# Patient Record
Sex: Male | Born: 1995 | Race: White | Hispanic: No | Marital: Married | Smoking: Never smoker
Health system: Southern US, Community
[De-identification: ages and names within clinical notes are randomized; demographics above are authoritative.]

## PROBLEM LIST (undated history)

## (undated) DIAGNOSIS — R51 Headache: Secondary | ICD-10-CM

## (undated) DIAGNOSIS — F419 Anxiety disorder, unspecified: Secondary | ICD-10-CM

## (undated) DIAGNOSIS — D751 Secondary polycythemia: Secondary | ICD-10-CM

## (undated) DIAGNOSIS — R519 Headache, unspecified: Secondary | ICD-10-CM

## (undated) HISTORY — DX: Headache, unspecified: R51.9

## (undated) HISTORY — PX: WISDOM TOOTH EXTRACTION: SHX21

## (undated) HISTORY — DX: Anxiety disorder, unspecified: F41.9

## (undated) HISTORY — DX: Secondary polycythemia: D75.1

## (undated) HISTORY — DX: Headache: R51

---
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
## 2017-02-24 ENCOUNTER — Ambulatory Visit (INDEPENDENT_AMBULATORY_CARE_PROVIDER_SITE_OTHER): Payer: No Typology Code available for payment source | Admitting: Family Medicine

## 2017-02-24 ENCOUNTER — Encounter: Payer: Self-pay | Admitting: Family Medicine

## 2017-02-24 DIAGNOSIS — K5900 Constipation, unspecified: Secondary | ICD-10-CM | POA: Insufficient documentation

## 2017-02-24 MED ORDER — POLYETHYLENE GLYCOL 3350 17 GM/SCOOP PO POWD: 17.0000 g | Freq: Every day | ORAL | 1 refills | Status: DC

## 2017-02-24 NOTE — Patient Instructions (Signed)
Miralax daily.  Follow up as needed.  Take care  Dr. Adriana Simas

## 2017-02-24 NOTE — Progress Notes (Signed)
   Subjective:  Patient ID: Donald Lane, male    DOB: 1996/02/11  Age: 21 y.o. MRN: [id number]  CC: Constipation/abdominal pain  HPI:  21 year old male presents with the above complaints.  Patient reports a 3-4 week history of constipation. He reports associated lower abdominal pain. Infrequent stooling. Hard to pass. Has to strain often. No hematochezia or melena. He reports significant stress. He has taken probiotics with some improvement. No laxatives or other medications tried. No fever. No reported weight loss.   Social Hx   Social History   Social History  . Marital status: Single    Spouse name: N/A  . Number of children: N/A  . Years of education: N/A   Social History Main Topics  . Smoking status: Never Smoker  . Smokeless tobacco: Never Used  . Alcohol use No  . Drug use: No  . Sexual activity: Not Asked   Other Topics Concern  . None   Social History Narrative  . None    Review of Systems  Constitutional: Negative.   Gastrointestinal: Positive for abdominal pain and constipation. Negative for blood in stool.  Psychiatric/Behavioral:       Stress.      Objective:  BP 122/80   Pulse 73   Temp 98.7 F (37.1 C) (Oral)   Wt 160 lb (72.6 kg)   SpO2 98%   BP/Weight 02/24/2017 11/23/2015  Systolic BP 122 108  Diastolic BP 80 82  Wt. (Lbs) 160 163  BMI - 21.65    Physical Exam  Constitutional: He appears well-developed. No distress.  Cardiovascular: Normal rate and regular rhythm.   Pulmonary/Chest: Effort normal and breath sounds normal.  Abdominal: Soft.  Mild tenderness in the periumbilical region.   Psychiatric:  Flat affect.   Vitals reviewed.  Assessment & Plan:   Problem List Items Addressed This Visit    Constipation    New problem. Trial of Miralax. Advised dietary changes and increased water intake.          Meds ordered this encounter  Medications  . polyethylene glycol powder (GLYCOLAX/MIRALAX) powder    Sig:  Take 17 g by mouth daily.    Dispense:  500 g    Refill:  1     Follow-up: PRN  Everlene Other DO [facility]

## 2017-02-24 NOTE — Progress Notes (Signed)
Pre visit review using our clinic review tool, if applicable. No additional management support is needed unless otherwise documented below in the visit note. 

## 2017-02-24 NOTE — Assessment & Plan Note (Signed)
New problem. Trial of Miralax. Advised dietary changes and increased water intake.

## 2017-06-24 ENCOUNTER — Encounter: Payer: Self-pay | Admitting: Family Medicine

## 2017-06-24 ENCOUNTER — Ambulatory Visit (INDEPENDENT_AMBULATORY_CARE_PROVIDER_SITE_OTHER): Payer: No Typology Code available for payment source | Admitting: Family Medicine

## 2017-06-24 VITALS — BP 116/70 | HR 73 | Temp 97.9°F | Wt 169.8 lb

## 2017-06-24 DIAGNOSIS — K5900 Constipation, unspecified: Secondary | ICD-10-CM | POA: Diagnosis not present

## 2017-06-24 DIAGNOSIS — G43009 Migraine without aura, not intractable, without status migrainosus: Secondary | ICD-10-CM | POA: Diagnosis not present

## 2017-06-24 DIAGNOSIS — K649 Unspecified hemorrhoids: Secondary | ICD-10-CM | POA: Diagnosis not present

## 2017-06-24 NOTE — Assessment & Plan Note (Signed)
Patient declined exam. Discussed checking stool cards. Discussed continuing Preparation H and returning for exam when he is ready.

## 2017-06-24 NOTE — Assessment & Plan Note (Addendum)
History consistent with migraines. Given age and change in headaches we will refer to neurology to consider treatment. Given return precautions.

## 2017-06-24 NOTE — Progress Notes (Signed)
Marikay Alar, MD Phone: [phone]  Donald Lane is a 21 y.o. male who presents today for same-day visit.  He states he is here for headaches. Notes chronic history of tension headaches that he describes as bitemporal headaches and frontal headache. Those can occur most days though over the past month he is having what he thinks are migraines. Feels like his whole head hurts. He has photophobia and phonophobia with it. Slight blurred vision when he has the headaches. He notes vomiting once with the headaches though no other vomiting. Notes a loss of energy with it. No numbness or weakness. He'll take 600-800 mg of ibuprofen once a day for the headache when he gets it. He does have a strong family history of migraines.  Patient reports abdominal pain from previously is significantly improved. He notes he is having more frequent bowel movements and is having them 2-3 times a day and they're normal. He notes occasionally having to strain. Notes the discomfort that he has had recently is more of an upset stomach issue. No significant abdominal pain. He does report some hemorrhoids that he does not want evaluated today that he wants to know what he can do for them. He's been trying Preparation H. Rarely he'll have a drop of blood when he wipes if he has to strain significantly.    ROS see history of present illness  Objective  Physical Exam Vitals:   06/24/17 0803  BP: 116/70  Pulse: 73  Temp: 97.9 F (36.6 C)  SpO2: 98%    BP Readings from Last 3 Encounters:  06/24/17 116/70  02/24/17 122/80  11/23/15 108/82   Wt Readings from Last 3 Encounters:  06/24/17 169 lb 12.8 oz (77 kg) (74 %, Z= 0.66)*  02/24/17 160 lb (72.6 kg) (64 %, Z= 0.37)*  11/23/15 163 lb (73.9 kg) (76 %, Z= 0.70)*   * Growth percentiles are based on CDC 2-20 Years data.    Physical Exam  Constitutional: No distress.  Cardiovascular: Normal rate, regular rhythm and normal heart sounds.     Pulmonary/Chest: Effort normal and breath sounds normal.  Abdominal: Soft. Bowel sounds are normal. He exhibits no distension. There is no tenderness.  Neurological: He is alert.  CN 2-12 intact, 5/5 strength in bilateral biceps, triceps, grip, quads, hamstrings, plantar and dorsiflexion, sensation to light touch intact in bilateral UE and LE, normal gait, normal rapid alternating movements, normal heel to shin tests, normal finger to nose test  Skin: Skin is warm and dry. He is not diaphoretic.     Assessment/Plan: Please see individual problem list.  Migraine without aura and without status migrainosus, not intractable History consistent with migraines. Given age and change in headaches we will refer to neurology to consider treatment. Given return precautions.  Hemorrhoids Patient declined exam. Discussed checking stool cards. Discussed continuing Preparation H and returning for exam when he is ready.  Constipation This has improved. Since this has improved his abdominal discomfort has improved significantly as well. Suspect constipation and straining is due to his hemorrhoids. Discussed occasional MiraLAX use if needed. He'll monitor and if does not continue to improve he'll let us know.   Orders Placed This Encounter  Procedures  . Fecal occult blood, imunochemical    Standing Status:   Future    Standing Expiration Date:   06/24/2018  . Ambulatory referral to Neurology    Referral Priority:   Routine    Referral Type:   Consultation    Referral Reason:  Specialty Services Required    Requested Specialty:   Neurology    Number of Visits Requested:   1   Marikay Alar, MD [facility]

## 2017-06-24 NOTE — Assessment & Plan Note (Signed)
This has improved. Since this has improved his abdominal discomfort has improved significantly as well. Suspect constipation and straining is due to his hemorrhoids. Discussed occasional MiraLAX use if needed. He'll monitor and if does not continue to improve he'll let us know.

## 2017-06-24 NOTE — Patient Instructions (Signed)
Nice to see you. We'll get you to see neurology. Please complete the stool cards. When you decide you want your hemorrhoids evaluated please let us know. If you develop numbness, weakness, rectal bleeding, or any new or changing symptoms please seek medical attention.

## 2017-09-10 ENCOUNTER — Ambulatory Visit: Payer: Self-pay | Admitting: *Deleted

## 2017-09-10 NOTE — Telephone Encounter (Signed)
   Reason for Disposition . Rectal bleeding is minimal (e.g., blood just on toilet paper, a few drops in toilet bowl)  Answer Assessment - Initial Assessment Questions Just finished zpac for a cold 1. APPEARANCE of BLOOD: "What color is it?" "Is it passed separately, on the surface of the stool, or mixed in with the stool?"  Slightly runny but on abx, light brown 2. AMOUNT: "How much blood was passed?"     Slight amount on tissue today pink 3. FREQUENCY: "How many times has blood been passed with the stools?"      Monday and 2 a day since 4. ONSET: "When was the blood first seen in the stools?" (Days or weeks)      Monday 5. DIARRHEA: "Is there also some diarrhea?" If so, ask: "How many diarrhea stools were passed in past 24 hours?"     Same as above 6. CONSTIPATION: "Do you have constipation?" If so, "How bad is it?"    no 7. RECURRENT SYMPTOMS: "Have you had blood in your stools before?" If so, ask: "When was the last time?" and "What happened that time?"     Bleeding hemrroids in the past 8. BLOOD THINNERS: "Do you take any blood thinners?" (e.g., Coumadin/warfarin, Pradaxa/dabigatran, aspirin)     no 9. OTHER SYMPTOMS: "Do you have any other symptoms?"  (e.g., abdominal pain, vomiting, dizziness, fever)     no 10. PREGNANCY: "Is there any chance you are pregnant?" "When was your last menstrual period?"     na  Protocols used: RECTAL BLEEDING-A-AH

## 2017-10-13 ENCOUNTER — Ambulatory Visit: Payer: No Typology Code available for payment source | Admitting: Family Medicine

## 2017-10-14 ENCOUNTER — Other Ambulatory Visit (HOSPITAL_COMMUNITY)
Admission: RE | Admit: 2017-10-14 | Discharge: 2017-10-14 | Disposition: A | Discharge status: Home / Self Care | Payer: No Typology Code available for payment source | Source: Ambulatory Visit | Attending: Internal Medicine | Admitting: Internal Medicine

## 2017-10-14 ENCOUNTER — Encounter: Payer: Self-pay | Admitting: Internal Medicine

## 2017-10-14 ENCOUNTER — Ambulatory Visit: Payer: No Typology Code available for payment source | Admitting: Internal Medicine

## 2017-10-14 DIAGNOSIS — Z0000 Encounter for general adult medical examination without abnormal findings: Secondary | ICD-10-CM

## 2017-10-14 DIAGNOSIS — Z113 Encounter for screening for infections with a predominantly sexual mode of transmission: Secondary | ICD-10-CM | POA: Insufficient documentation

## 2017-10-14 DIAGNOSIS — F419 Anxiety disorder, unspecified: Secondary | ICD-10-CM

## 2017-10-14 DIAGNOSIS — Z23 Encounter for immunization: Secondary | ICD-10-CM | POA: Diagnosis not present

## 2017-10-14 DIAGNOSIS — R51 Headache: Secondary | ICD-10-CM | POA: Diagnosis not present

## 2017-10-14 DIAGNOSIS — R519 Headache, unspecified: Secondary | ICD-10-CM

## 2017-10-14 DIAGNOSIS — F39 Unspecified mood [affective] disorder: Secondary | ICD-10-CM | POA: Insufficient documentation

## 2017-10-14 DIAGNOSIS — Z1329 Encounter for screening for other suspected endocrine disorder: Secondary | ICD-10-CM

## 2017-10-14 DIAGNOSIS — G43009 Migraine without aura, not intractable, without status migrainosus: Secondary | ICD-10-CM | POA: Diagnosis not present

## 2017-10-14 LAB — URINALYSIS, ROUTINE W REFLEX MICROSCOPIC
Bilirubin Urine: NEGATIVE
Hgb urine dipstick: NEGATIVE
Ketones, ur: NEGATIVE
Leukocytes, UA: NEGATIVE
NITRITE: NEGATIVE
RBC / HPF: NONE SEEN (ref 0–?)
SPECIFIC GRAVITY, URINE: 1.025 (ref 1.000–1.030)
Total Protein, Urine: NEGATIVE
Urine Glucose: NEGATIVE
Urobilinogen, UA: 0.2 (ref 0.0–1.0)
pH: 6 (ref 5.0–8.0)

## 2017-10-14 LAB — CBC WITH DIFFERENTIAL/PLATELET
BASOS PCT: 0.6 % (ref 0.0–3.0)
Basophils Absolute: 0 10*3/uL (ref 0.0–0.1)
EOS PCT: 1.4 % (ref 0.0–5.0)
Eosinophils Absolute: 0.1 10*3/uL (ref 0.0–0.7)
HEMATOCRIT: 49.2 % — AB (ref 36.0–49.0)
HEMOGLOBIN: 17.3 g/dL — AB (ref 12.0–16.0)
LYMPHS PCT: 42.7 % (ref 24.0–48.0)
Lymphs Abs: 2.2 10*3/uL (ref 0.7–4.0)
MCHC: 35.3 g/dL (ref 31.0–37.0)
MCV: 90 fl (ref 78.0–98.0)
Monocytes Absolute: 0.6 10*3/uL (ref 0.1–1.0)
Monocytes Relative: 10.8 % (ref 3.0–12.0)
Neutro Abs: 2.3 10*3/uL (ref 1.4–7.7)
Neutrophils Relative %: 44.5 % (ref 43.0–71.0)
Platelets: 172 10*3/uL (ref 150.0–575.0)
RBC: 5.46 Mil/uL (ref 3.80–5.70)
RDW: 12.4 % (ref 11.4–15.5)
WBC: 5.1 10*3/uL (ref 4.5–13.5)

## 2017-10-14 LAB — COMPREHENSIVE METABOLIC PANEL
ALBUMIN: 5.1 g/dL (ref 3.5–5.2)
ALT: 19 U/L (ref 0–53)
AST: 23 U/L (ref 0–37)
Alkaline Phosphatase: 77 U/L (ref 52–171)
BUN: 9 mg/dL (ref 6–23)
CALCIUM: 9.6 mg/dL (ref 8.4–10.5)
CHLORIDE: 102 meq/L (ref 96–112)
CO2: 28 mEq/L (ref 19–32)
Creatinine, Ser: 0.88 mg/dL (ref 0.40–1.50)
GFR: 118.36 mL/min (ref >60.00)
Glucose, Bld: 84 mg/dL (ref 70–99)
POTASSIUM: 3.8 meq/L (ref 3.5–5.1)
Sodium: 137 mEq/L (ref 135–145)
Total Bilirubin: 0.7 mg/dL (ref 0.2–1.2)
Total Protein: 7.9 g/dL (ref 6.0–8.3)

## 2017-10-14 LAB — T4, FREE: Free T4: 0.74 ng/dL (ref 0.60–1.60)

## 2017-10-14 LAB — TSH: TSH: 1.89 u[IU]/mL (ref 0.40–5.00)

## 2017-10-14 MED ORDER — BUSPIRONE HCL 7.5 MG PO TABS: 7.5000 mg | ORAL_TABLET | Freq: Two times a day (BID) | ORAL | 1 refills | Status: DC

## 2017-10-14 MED ORDER — SUMATRIPTAN SUCCINATE 25 MG PO TABS: 25.0000 mg | ORAL_TABLET | ORAL | 0 refills | Status: DC | PRN

## 2017-10-14 NOTE — Progress Notes (Addendum)
Chief Complaint  Patient presents with  . Headache  . Anxiety   Pt presents for f/u  1. Anxiety not controlled he used to be on meds in med school PHQ 9 14 today though he reports he is not sad. He is agreeable to therapy and medications for anxiety  2. Migraines-daily severity varies (5-6/10 max 10/10) and he is bothered by light and sound and has to get underneath a blanket. H/a can last from 1 to 24 hrs and takes Excedrin migraines which helps sometimes and Ibuprofen. H/a associated with n/v but he does not think he needs medication. He has had h/a since middle school but h/a worsening x 1 year. FH mom and dad and m cousins with migraines. He does drink caffeine but has reduced coffee intake, but does drink soda typically caffeine free and does drink sweet tea.  He sleeps 6-8 hours qhs. H/a is around temples and frontal w/o other radiation. Eyes water at times with h/a like 1x per month. He does get his eye checked frequently.    3. ?hemorrhoids history he will not show me today for exam and reports they have reduced in size w/o bleeding and he is having regular stools w/o miralax    Review of Systems  HENT: Negative for hearing loss.   Eyes:       Denies vision problems   Respiratory: Negative for shortness of breath.   Cardiovascular: Negative for chest pain.  Gastrointestinal: Positive for nausea and vomiting. Negative for abdominal pain and constipation.  Musculoskeletal: Negative for joint pain.  Skin:       H/o moles bx'ed has one in right parietal scalp   Neurological: Positive for headaches.  Psychiatric/Behavioral: Negative for depression. The patient is nervous/anxious. The patient does not have insomnia.    Past Medical History:  Diagnosis Date  . Anxiety   . Frequent headaches    Past Surgical History:  Procedure Laterality Date  . TONSILLECTOMY AND ADENOIDECTOMY  2008   Family History  Problem Relation Age of Onset  . Alcohol abuse Paternal Aunt    Social History    Socioeconomic History  . Marital status: Single    Spouse name: Not on file  . Number of children: Not on file  . Years of education: Not on file  . Highest education level: Not on file  Social Needs  . Financial resource strain: Not on file  . Food insecurity - worry: Not on file  . Food insecurity - inability: Not on file  . Transportation needs - medical: Not on file  . Transportation needs - non-medical: Not on file  Occupational History  . Not on file  Tobacco Use  . Smoking status: Never Smoker  . Smokeless tobacco: Never Used  Substance and Sexual Activity  . Alcohol use: No    Alcohol/week: 0.0 oz  . Drug use: No  . Sexual activity: Not on file  Other Topics Concern  . Not on file  Social History Narrative  . Not on file   Current Meds  Medication Sig  . aspirin-acetaminophen-caffeine (MIGRAINE FORMULA) 250-250-65 MG tablet Take by mouth every 6 (six) hours as needed for headache. Excedrin migraine  . ibuprofen (ADVIL,MOTRIN) 200 MG tablet Take 200 mg by mouth every 6 (six) hours as needed.  . Lidocaine-Glycerin (PREPARATION H) 5-14.4 % CREA Place rectally.  . phenylephrine-shark liver oil-mineral oil-petrolatum (PREPARATION H) 0.25-14-74.9 % rectal ointment Place 1 application rectally 2 (two) times daily as needed for hemorrhoids.  No Known Allergies No results found for this or any previous visit (from the past 2160 hour(s)). Objective  There is no height or weight on file to calculate BMI. Wt Readings from Last 3 Encounters:  06/24/17 169 lb 12.8 oz (77 kg) (74 %, Z= 0.66)*  02/24/17 160 lb (72.6 kg) (64 %, Z= 0.37)*  11/23/15 163 lb (73.9 kg) (76 %, Z= 0.70)*   * Growth percentiles are based on CDC (Boys, 2-20 Years) data.   Temp Readings from Last 3 Encounters:  06/24/17 97.9 F (36.6 C) (Oral)  02/24/17 98.7 F (37.1 C) (Oral)  11/23/15 98.4 F (36.9 C) (Oral)   BP Readings from Last 3 Encounters:  06/24/17 116/70  02/24/17 122/80  11/23/15  108/82 (14 %, Z = -1.08 /  88 %, Z = 1.17)*   *BP percentiles are based on the August 2017 AAP Clinical Practice Guideline for boys   Pulse Readings from Last 3 Encounters:  06/24/17 73  02/24/17 73  11/23/15 67   o2 room air 98%  Physical Exam  Constitutional: He is oriented to person, place, and time and well-developed, well-nourished, and in no distress. Vital signs are normal.  HENT:  Head: Normocephalic and atraumatic.  Mouth/Throat: Oropharynx is clear and moist and mucous membranes are normal.  Eyes: Conjunctivae are normal. Pupils are equal, round, and reactive to light.  Cardiovascular: Normal rate, regular rhythm and normal heart sounds.  No murmur heard. Neg leg edema b/l   Pulmonary/Chest: Effort normal and breath sounds normal.  Abdominal: Soft. Bowel sounds are normal. There is no tenderness.  Neurological: He is alert and oriented to person, place, and time. He has normal motor skills. Gait normal. Gait normal.  CN 2-12 grossly intact moving all 4 ext  Nl motor strength   Skin: Skin is warm, dry and intact.     Psychiatric: Mood, memory, affect and judgment normal.  Nursing note and vitals reviewed.   Assessment   1. 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 Anxiety  3. H/o ? Hemorrhoids, constipation resolved though pt will not let MD look at rectal region  4. Hm  Plan  1.  MRI brain Prn Imitrex pt does not like meds qd so does not want to do preventive tx  [facility] to [facility] [place]   2. Buspirone 7.5 mg bid  Refer therapy here   3. Monitor pt not having to use Miralax   4.  Labs today CMET, CBC, UA, TSH, T4, STD check hep B status   Given flu shot today Ask about Tdap and HPV at f/u  rec call dermatology and sch tbse has had nevi bx'ed before.   STD check today   Provider: Dr. French Anaracy [facility]

## 2017-10-14 NOTE — Patient Instructions (Addendum)
Please schedule dermatology visit  Follow up in 4-5 weeks  See therapist  And start new medications imitrex migraines  Buspirone anxiety  Happy holidays  Migraine Headache A migraine headache is an intense, throbbing pain on one side or both sides of the head. Migraines may also cause other symptoms, such as nausea, vomiting, and sensitivity to light and noise. What are the causes? Doing or taking certain things may also trigger migraines, such as:  Alcohol.  Smoking.  Medicines, such as: ? Medicine used to treat chest pain (nitroglycerine). ? Birth control pills. ? Estrogen pills. ? Certain blood pressure medicines.  Aged cheeses, chocolate, or caffeine.  Foods or drinks that contain nitrates, glutamate, aspartame, or tyramine.  Physical activity.  Other things that may trigger a migraine include:  Menstruation.  Pregnancy.  Hunger.  Stress, lack of sleep, too much sleep, or fatigue.  Weather changes.  What increases the risk? The following factors may make you more likely to experience migraine headaches:  Age. Risk increases with age.  Family history of migraine headaches.  Being Caucasian.  Depression and anxiety.  Obesity.  Being a woman.  Having a hole in the heart (patent foramen ovale) or other heart problems.  What are the signs or symptoms? The main symptom of this condition is pulsating or throbbing pain. Pain may:  Happen in any area of the head, such as on one side or both sides.  Interfere with daily activities.  Get worse with physical activity.  Get worse with exposure to bright lights or loud noises.  Other symptoms may include:  Nausea.  Vomiting.  Dizziness.  General sensitivity to bright lights, loud noises, or smells.  Before you get a migraine, you may get warning signs that a migraine is developing (aura). An aura may include:  Seeing flashing lights or having blind spots.  Seeing bright spots, halos, or zigzag  lines.  Having tunnel vision or blurred vision.  Having numbness or a tingling feeling.  Having trouble talking.  Having muscle weakness.  How is this diagnosed? A migraine headache can be diagnosed based on:  Your symptoms.  A physical exam.  Tests, such as CT scan or MRI of the head. These imaging tests can help rule out other causes of headaches.  Taking fluid from the spine (lumbar puncture) and analyzing it (cerebrospinal fluid analysis, or CSF analysis).  How is this treated? A migraine headache is usually treated with medicines that:  Relieve pain.  Relieve nausea.  Prevent migraines from coming back.  Treatment may also include:  Acupuncture.  Lifestyle changes like avoiding foods that trigger migraines.  Follow these instructions at home: Medicines  Take over-the-counter and prescription medicines only as told by your health care provider.  Do not drive or use heavy machinery while taking prescription pain medicine.  To prevent or treat constipation while you are taking prescription pain medicine, your health care provider may recommend that you: ? Drink enough fluid to keep your urine clear or pale yellow. ? Take over-the-counter or prescription medicines. ? Eat foods that are high in fiber, such as fresh fruits and vegetables, whole grains, and beans. ? Limit foods that are high in fat and processed sugars, such as fried and sweet foods. Lifestyle  Avoid alcohol use.  Do not use any products that contain nicotine or tobacco, such as cigarettes and e-cigarettes. If you need help quitting, ask your health care provider.  Get at least 8 hours of sleep every night.  Limit  your stress. General instructions   Keep a journal to find out what may trigger your migraine headaches. For example, write down: ? What you eat and drink. ? How much sleep you get. ? Any change to your diet or medicines.  If you have a migraine: ? Avoid things that make your  symptoms worse, such as bright lights. ? It may help to lie down in a dark, quiet room. ? Do not drive or use heavy machinery. ? Ask your health care provider what activities are safe for you while you are experiencing symptoms.  Keep all follow-up visits as told by your health care provider. This is important. Contact a health care provider if:  You develop symptoms that are different or more severe than your usual migraine symptoms. Get help right away if:  Your migraine becomes severe.  You have a fever.  You have a stiff neck.  You have vision loss.  Your muscles feel weak or like you cannot control them.  You start to lose your balance often.  You develop trouble walking.  You faint. This information is not intended to replace advice given to you by your health care provider. Make sure you discuss any questions you have with your health care provider. Document Released: 10/14/2005 Document Revised: 05/03/2016 Document Reviewed: 04/01/2016 Elsevier Interactive Patient Education  2017 [employer].

## 2017-10-15 LAB — RPR: RPR Ser Ql: NONREACTIVE

## 2017-10-15 LAB — HEPATITIS C ANTIBODY
HEP C AB: NONREACTIVE
SIGNAL TO CUT-OFF: 0.01 (ref <1.00)

## 2017-10-15 LAB — URINE CYTOLOGY ANCILLARY ONLY
CHLAMYDIA, DNA PROBE: NEGATIVE
NEISSERIA GONORRHEA: NEGATIVE
Trichomonas: NEGATIVE

## 2017-10-15 LAB — HSV 1 ANTIBODY, IGG: HSV 1 Glycoprotein G Ab, IgG: <0.9 index

## 2017-10-15 LAB — HSV 2 ANTIBODY, IGG

## 2017-10-15 LAB — HEPATITIS B SURFACE ANTIGEN: Hepatitis B Surface Ag: NONREACTIVE

## 2017-10-15 LAB — HEPATITIS B SURFACE ANTIBODY, QUANTITATIVE

## 2017-10-15 LAB — HEPATITIS B CORE ANTIBODY, TOTAL: Hep B Core Total Ab: NONREACTIVE

## 2017-10-15 LAB — HIV ANTIBODY (ROUTINE TESTING W REFLEX): HIV: NONREACTIVE

## 2017-10-27 ENCOUNTER — Ambulatory Visit: Payer: No Typology Code available for payment source

## 2017-11-01 ENCOUNTER — Ambulatory Visit
Admission: RE | Admit: 2017-11-01 | Discharge: 2017-11-01 | Disposition: A | Discharge status: Home / Self Care | Payer: No Typology Code available for payment source | Source: Ambulatory Visit | Attending: Internal Medicine | Admitting: Internal Medicine

## 2017-11-01 DIAGNOSIS — R51 Headache: Secondary | ICD-10-CM | POA: Insufficient documentation

## 2017-11-11 ENCOUNTER — Encounter: Payer: Self-pay | Admitting: Neurology

## 2017-11-11 ENCOUNTER — Ambulatory Visit (INDEPENDENT_AMBULATORY_CARE_PROVIDER_SITE_OTHER): Payer: No Typology Code available for payment source | Admitting: Neurology

## 2017-11-11 VITALS — BP 134/80 | HR 98 | Ht 72.0 in | Wt 175.2 lb

## 2017-11-11 DIAGNOSIS — G44229 Chronic tension-type headache, not intractable: Secondary | ICD-10-CM

## 2017-11-11 DIAGNOSIS — G43009 Migraine without aura, not intractable, without status migrainosus: Secondary | ICD-10-CM

## 2017-11-11 MED ORDER — SUMATRIPTAN SUCCINATE 100 MG PO TABS: ORAL_TABLET | ORAL | 2 refills | Status: DC

## 2017-11-11 MED ORDER — NORTRIPTYLINE HCL 25 MG PO CAPS: 25.0000 mg | ORAL_CAPSULE | Freq: Every day | ORAL | 3 refills | Status: DC

## 2017-11-11 NOTE — Progress Notes (Signed)
NEUROLOGY CONSULTATION NOTE  Donald AlyChristopher J Lane MRN: [id number] DOB: 1996-03-03  Referring provider: Dr. Judie Lane Primary care provider: Dr. Birdie Lane  Reason for consult:  migraine  HISTORY OF PRESENT ILLNESS: Donald Lane is a 22 year old male with migraines and anxiety who presents for migraines.  History supplemented by primary medicine notes.  Onset:  He has had "headaches" since childhood but started having "migraines" for the past year Location:  Migraines:  Holocephalic/above right eye; Headaches:  Bi-temporal Quality:  Migraines:  Pounding; Headaches:  pressure Intensity:  Migraines:  Severe; Headaches: dull-moderate Aura:  no Prodrome:  no Postdrome:  no Associated symptoms:  Migraines: photophobia, phonophobia.  Sometimes nausea, rarely vomiting.  No osmophobia, autonomic symptoms or visual disturbance.  He denies new worse headache of his life or causing him to wake up from sleep Duration:  3 to 4 hours Frequency:  Migraines:  1 to 2 days a month; Headaches: 3 days a week Frequency of abortive medication: ibuprofen once a week Triggers/exacerbating factors:  stress Relieving factors:  rest Activity:  Migraines aggravated.  Headaches are not aggravated.  Past NSAIDS:  ASA Past analgesics:  Excedrin Past abortive triptans:  no Past muscle relaxants:  no Past anti-emetic:  no Past antihypertensive medications:  no Past antidepressant medications:  no Past anticonvulsant medications:  no Past vitamins/Herbal/Supplements:  Essential oils Past antihistamines/decongestants:  no Other past therapies:  no  Rescue protocol:  First ibuprofen (cannot tell if headache will progress to migraine.  Second, sumatriptan 25mg  (in 30 minutes if needed, repeat dose once) Current NSAIDS:  ibuprofen  Current analgesics:  no Current triptans:  sumatriptan 25mg  Current anti-emetic:  no Current muscle relaxants:  no Current anti-anxiolytic:   buspirone Current sleep aide:  no Current Antihypertensive medications:  no Current Antidepressant medications:  no Current Anticonvulsant medications:  no Current Vitamins/Herbal/Supplements:  no Current Antihistamines/Decongestants:  no Other therapy:  no   Caffeine:  Coffee once a week, tea Alcohol:  no Smoker:  no Diet:  Needs to increase water, soda Exercise:  no Depression/anxiety:  anxiety Sleep hygiene:  Sleeps well but does not follow routine sleep schedule Family history of headache:  mother, father, maternal cousins  MRI of brain without contrast from 11/01/17 was personally reviewed and is unremarkable.  10/14/17 CMP:  Na 137, K 3.8, Cl 102, CO2 28, glucose 84, BUN 9, Cr 0.88, t bili 0.7, ALP 77, AST 23, ALT 19.  PAST MEDICAL HISTORY: Past Medical History:  Diagnosis Date  . Anxiety   . Frequent headaches     PAST SURGICAL HISTORY: Past Surgical History:  Procedure Laterality Date  . TONSILLECTOMY AND ADENOIDECTOMY  2008  . WISDOM TOOTH EXTRACTION      MEDICATIONS: Current Outpatient Medications on File Prior to Visit  Medication Sig Dispense Refill  . aspirin-acetaminophen-caffeine (MIGRAINE FORMULA) 250-250-65 MG tablet Take by mouth every 6 (six) hours as needed for headache. Excedrin migraine    . busPIRone (BUSPAR) 7.5 MG tablet Take 1 tablet (7.5 mg total) by mouth 2 (two) times daily. 60 tablet 1  . ibuprofen (ADVIL,MOTRIN) 200 MG tablet Take 200 mg by mouth every 6 (six) hours as needed.    . Lidocaine-Glycerin (PREPARATION H) 5-14.4 % CREA Place rectally.    . phenylephrine-shark liver oil-mineral oil-petrolatum (PREPARATION H) 0.25-14-74.9 % rectal ointment Place 1 application rectally 2 (two) times daily as needed for hemorrhoids.     No current facility-administered medications on file prior to visit.     ALLERGIES: No  Known Allergies  FAMILY HISTORY: Family History  Problem Relation Age of Onset  . Alcohol abuse Paternal Aunt   . Diabetes  Father   . Diabetes Maternal Grandmother   . Migraines Maternal Grandfather   . Cancer Paternal Grandfather     SOCIAL HISTORY: Social History   Socioeconomic History  . Marital status: Single    Spouse name: Not on file  . Number of children: Not on file  . Years of education: Not on file  . Highest education level: 12th grade  Social Needs  . Financial resource strain: Not on file  . Food insecurity - worry: Not on file  . Food insecurity - inability: Not on file  . Transportation needs - medical: Not on file  . Transportation needs - non-medical: Not on file  Occupational History  . 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 Smoking status: Never Smoker  . Smokeless tobacco: Never Used  Substance and Sexual Activity  . Alcohol use: No    Alcohol/week: 0.0 oz  . Drug use: No  . Sexual activity: Not on file  Other Topics Concern  . Not on file  Social History Narrative   Single, lives with parents in a 2 story home. Has 3 dogs and 1 cat. Has an occasional coffee or soda. Drinks approximately 12oz of hot tea a day.    REVIEW OF SYSTEMS: Constitutional: No fevers, chills, or sweats, no generalized fatigue, change in appetite Eyes: No visual changes, double vision, eye pain Ear, nose and throat: No hearing loss, ear pain, nasal congestion, sore throat Cardiovascular: No chest pain, palpitations Respiratory:  No shortness of breath at rest or with exertion, wheezes GastrointestinaI: No nausea, vomiting, diarrhea, abdominal pain, fecal incontinence Genitourinary:  No dysuria, urinary retention or frequency Musculoskeletal:  No neck pain, back pain Integumentary: No rash, pruritus, skin lesions Neurological: as above Psychiatric: No depression, insomnia, anxiety Endocrine: No palpitations, fatigue, diaphoresis, mood swings, change in appetite, change in weight, increased thirst Hematologic/Lymphatic:  No purpura, petechiae. Allergic/Immunologic: no  itchy/runny eyes, nasal congestion, recent allergic reactions, rashes  PHYSICAL EXAM: Vitals:   11/11/17 1512  BP: 134/80  Pulse: 98  SpO2: 98%   General: No acute distress.  Patient appears well-groomed.  Head:  Normocephalic/atraumatic Eyes:  fundi examined but not visualized Neck: supple, no paraspinal tenderness, full range of motion Back: No paraspinal tenderness Heart: regular rate and rhythm Lungs: Clear to auscultation bilaterally. Vascular: No carotid bruits. Neurological Exam: Mental status: alert and oriented to person, place, and time, recent and remote memory intact, fund of knowledge intact, attention and concentration intact, speech fluent and not dysarthric, language intact. Cranial nerves: CN I: not tested CN II: pupils equal, round and reactive to light, visual fields intact CN III, IV, VI:  full range of motion, no nystagmus, no ptosis CN V: facial sensation intact CN VII: upper and lower face symmetric CN VIII: hearing intact CN IX, X: gag intact, uvula midline CN XI: sternocleidomastoid and trapezius muscles intact CN XII: tongue midline Bulk & Tone: normal, no fasciculations. Motor:  5/5 throughout  Sensation: temperature and vibration sensation intact. Deep Tendon Reflexes:  2+ throughout, toes downgoing.  Finger to nose testing:  Without dysmetria.  Heel to shin:  Without dysmetria.  Gait:  Normal station and stride.  Able to turn and tandem walk. Romberg negative.  IMPRESSION: Migraine without aura Chronic tension type headache  PLAN: 1.  Start nortriptyline 25mg  at bedtime.  We can increase dose in 4 weeks if needed. 2.  For abortive therapy, if ibuprofen not effective in 30 minutes, take sumatriptan 50 to 100mg .  May repeat dose once in 2 hours if needed. 3.  Lifestyle modification:  Increase water intake, increase exercise, routine sleep cycle, eliminate soda, herbal tea only 4.  Consider magnesium, B2, CoQ10 5.  Follow up in 3  months.  Thank you for allowing me to take part in the care of this patient.  Shon Millet, DO  CC:  Marikay Alar, MD  Quentin Ore, MD

## 2017-11-11 NOTE — Patient Instructions (Signed)
Migraine Recommendations: 1.  Start nortriptyline 25mg  at bedtime.  Call in 4 weeks with update and we can adjust dose if needed. 2.  Take ibuprofen 600mg  to 800mg  at earliest onset of headache at earliest onset of headache.  If not resolved in 30 minutes, take sumatriptan 50mg  to 100mg .  May repeat sumatriptan once in 2 hours if needed.  Do not exceed two doses in 24 hours. 3.  Limit use of pain relievers to no more than 2 days out of the week.  These medications include acetaminophen, ibuprofen, triptans and narcotics.  This will help reduce risk of rebound headaches. 4.  Be aware of common food triggers such as processed sweets, processed foods with nitrites (such as deli meat, hot dogs, sausages), foods with MSG, alcohol (such as wine), chocolate, certain cheeses, certain fruits (dried fruits, bananas, some citrus fruit), vinegar, diet soda. 4.  Avoid caffeine 5.  Routine exercise 6.  Proper sleep hygiene 7.  Stay adequately hydrated with water 8.  Keep a headache diary. 9.  Maintain proper stress management. 10.  Do not skip meals. 11.  Consider supplements:  Magnesium citrate 400mg  to 600mg  daily, riboflavin 400mg , Coenzyme Q 10 100mg  three times daily 12.  Follow up in 3 months.

## 2017-11-25 ENCOUNTER — Ambulatory Visit: Payer: No Typology Code available for payment source | Admitting: Internal Medicine

## 2017-11-25 ENCOUNTER — Encounter: Payer: Self-pay | Admitting: Internal Medicine

## 2017-11-25 VITALS — BP 102/60 | HR 97 | Temp 99.4°F | Ht 72.0 in | Wt 174.0 lb

## 2017-11-25 DIAGNOSIS — Z23 Encounter for immunization: Secondary | ICD-10-CM | POA: Diagnosis not present

## 2017-11-25 DIAGNOSIS — D751 Secondary polycythemia: Secondary | ICD-10-CM

## 2017-11-25 DIAGNOSIS — G43009 Migraine without aura, not intractable, without status migrainosus: Secondary | ICD-10-CM

## 2017-11-25 DIAGNOSIS — F419 Anxiety disorder, unspecified: Secondary | ICD-10-CM

## 2017-11-25 DIAGNOSIS — K5900 Constipation, unspecified: Secondary | ICD-10-CM | POA: Diagnosis not present

## 2017-11-25 DIAGNOSIS — G4700 Insomnia, unspecified: Secondary | ICD-10-CM | POA: Diagnosis not present

## 2017-11-25 LAB — CBC
HCT: 49.3 % — ABNORMAL HIGH (ref 36.0–49.0)
HEMOGLOBIN: 17.4 g/dL — AB (ref 12.0–16.0)
MCHC: 35.3 g/dL (ref 31.0–37.0)
MCV: 88.5 fl (ref 78.0–98.0)
Platelets: 151 10*3/uL (ref 150.0–575.0)
RBC: 5.58 Mil/uL (ref 3.80–5.70)
RDW: 12.5 % (ref 11.4–15.5)
WBC: 4.6 10*3/uL (ref 4.5–13.5)

## 2017-11-25 MED ORDER — BUSPIRONE HCL 10 MG PO TABS: 10.0000 mg | ORAL_TABLET | Freq: Two times a day (BID) | ORAL | 5 refills | Status: DC

## 2017-11-25 NOTE — Progress Notes (Signed)
Chief Complaint  Patient presents with  . Follow-up   Follow up  1. Migraines doing well MRI neg and saw Dr. Everlena Cooper neurology who Rx Nortriptyline he missed 2 doses and had h/a but has only had to use Imitrex x 1 time since last visit  2. Anxiety and sleep improved with buspar 7.5 mg bid, anxiety was causing lack of sleep but since anxiety better sleep is better. He does not think he needs to seek therapy as of now.  3. Constipation miralax helps and increased water intake helps and also increase fiber intake  4. Elevated H/H he reports before labs done he was [place] will repeat labs today .    Review of Systems  HENT: Negative for hearing loss.   Respiratory: Negative for shortness of breath.   Cardiovascular: Negative for chest pain.  Neurological: Negative for headaches.  Psychiatric/Behavioral:       Anxiety and sleep improved    Past Medical History:  Diagnosis Date  . Anxiety   . Frequent headaches    Past Surgical History:  Procedure Laterality Date  . TONSILLECTOMY AND ADENOIDECTOMY  2008  . WISDOM TOOTH EXTRACTION     Family History  Problem Relation Age of Onset  . Alcohol abuse Paternal Aunt   . Diabetes Father   . Diabetes Maternal Grandmother   . Migraines Maternal Grandfather   . Cancer Paternal Grandfather    Social History   Socioeconomic History  . Marital status: Single    Spouse name: Not on file  . Number of children: Not on file  . Years of education: Not on file  . Highest education level: 12th grade  Social Needs  . Financial resource strain: Not on file  . Food insecurity - worry: Not on file  . Food insecurity - inability: Not on file  . Transportation needs - medical: Not on file  . Transportation needs - non-medical: Not on file  Occupational History  . 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 Smoking status: Never Smoker  . Smokeless tobacco: Never Used  Substance and Sexual Activity  . Alcohol  use: No    Alcohol/week: 0.0 oz  . Drug use: No  . Sexual activity: Not on file  Other Topics Concern  . Not on file  Social History Narrative   Single, lives with parents in a 2 story home. Has 3 dogs and 1 cat. Has an occasional coffee or soda. Drinks approximately 12oz of hot tea a day.   Current Meds  Medication Sig  . aspirin-acetaminophen-caffeine (MIGRAINE FORMULA) 250-250-65 MG tablet Take by mouth every 6 (six) hours as needed for headache. Excedrin migraine  . busPIRone (BUSPAR) 10 MG tablet Take 1 tablet (10 mg total) by mouth 2 (two) times daily.  Marland Kitchen ibuprofen (ADVIL,MOTRIN) 200 MG tablet Take 200 mg by mouth every 6 (six) hours as needed.  . Lidocaine-Glycerin (PREPARATION H) 5-14.4 % CREA Place rectally.  . nortriptyline (PAMELOR) 25 MG capsule Take 1 capsule (25 mg total) by mouth at bedtime.  . phenylephrine-shark liver oil-mineral oil-petrolatum (PREPARATION H) 0.25-14-74.9 % rectal ointment Place 1 application rectally 2 (two) times daily as needed for hemorrhoids.  . SUMAtriptan (IMITREX) 100 MG tablet Take 1 tablet earliest onset of headache.  May repeat x1 in 2 hours if headache persists or recurs.  Do not exceed 2 tablets in 24 hours  . [DISCONTINUED] busPIRone (BUSPAR) 7.5 MG tablet Take 1 tablet (7.5  mg total) by mouth 2 (two) times daily.   No Known Allergies Recent Results (from the past 2160 hour(s))  Urine cytology ancillary only     Status: None   Collection Time: 10/14/17 12:00 AM  Result Value Ref Range   Chlamydia Negative     Comment: Normal Reference Range - Negative   Neisseria gonorrhea Negative     Comment: Normal Reference Range - Negative   Trichomonas Negative     Comment: Normal Reference Range - Negative  Comprehensive metabolic panel     Status: None   Collection Time: 10/14/17  1:51 PM  Result Value Ref Range   Sodium 137 135 - 145 mEq/L   Potassium 3.8 3.5 - 5.1 mEq/L   Chloride 102 96 - 112 mEq/L   CO2 28 19 - 32 mEq/L   Glucose, Bld  84 70 - 99 mg/dL   BUN 9 6 - 23 mg/dL   Creatinine, Ser 1.61 0.40 - 1.50 mg/dL   Total Bilirubin 0.7 0.2 - 1.2 mg/dL   Alkaline Phosphatase 77 52 - 171 U/L   AST 23 0 - 37 U/L   ALT 19 0 - 53 U/L   Total Protein 7.9 6.0 - 8.3 g/dL   Albumin 5.1 3.5 - 5.2 g/dL   Calcium 9.6 8.4 - 09.6 mg/dL   GFR 045.40 >98.11 mL/min  CBC with Differential/Platelet     Status: Abnormal   Collection Time: 10/14/17  1:51 PM  Result Value Ref Range   WBC 5.1 4.5 - 13.5 K/uL   RBC 5.46 3.80 - 5.70 Mil/uL   Hemoglobin 17.3 (H) 12.0 - 16.0 g/dL   HCT 91.4 (H) 78.2 - 95.6 %   MCV 90.0 78.0 - 98.0 fl   MCHC 35.3 31.0 - 37.0 g/dL   RDW 21.3 08.6 - 57.8 %   Platelets 172.0 150.0 - 575.0 K/uL   Neutrophils Relative % 44.5 43.0 - 71.0 %   Lymphocytes Relative 42.7 24.0 - 48.0 %   Monocytes Relative 10.8 3.0 - 12.0 %   Eosinophils Relative 1.4 0.0 - 5.0 %   Basophils Relative 0.6 0.0 - 3.0 %   Neutro Abs 2.3 1.4 - 7.7 K/uL   Lymphs Abs 2.2 0.7 - 4.0 K/uL   Monocytes Absolute 0.6 0.1 - 1.0 K/uL   Eosinophils Absolute 0.1 0.0 - 0.7 K/uL   Basophils Absolute 0.0 0.0 - 0.1 K/uL  T4, free     Status: None   Collection Time: 10/14/17  1:51 PM  Result Value Ref Range   Free T4 0.74 0.60 - 1.60 ng/dL    Comment: Specimens from patients who are undergoing biotin therapy and /or ingesting biotin supplements may contain high levels of biotin.  The higher biotin concentration in these specimens interferes with this Free T4 assay.  Specimens that contain high levels  of biotin may cause false high results for this Free T4 assay.  Please interpret results in light of the total clinical presentation of the patient.    TSH     Status: None   Collection Time: 10/14/17  1:51 PM  Result Value Ref Range   TSH 1.89 0.40 - 5.00 uIU/mL  Urinalysis, Routine w reflex microscopic     Status: None   Collection Time: 10/14/17  1:51 PM  Result Value Ref Range   Color, Urine YELLOW Yellow;Lt. Yellow   APPearance CLEAR Clear    Specific Gravity, Urine 1.025 1.000 - 1.030   pH 6.0 5.0 - 8.0  Total Protein, Urine NEGATIVE Negative   Urine Glucose NEGATIVE Negative   Ketones, ur NEGATIVE Negative   Bilirubin Urine NEGATIVE Negative   Hgb urine dipstick NEGATIVE Negative   Urobilinogen, UA 0.2 0.0 - 1.0   Leukocytes, UA NEGATIVE Negative   Nitrite NEGATIVE Negative   WBC, UA 0-2/hpf 0-2/hpf   RBC / HPF none seen 0-2/hpf  RPR     Status: None   Collection Time: 10/14/17  1:51 PM  Result Value Ref Range   RPR Ser Ql NON-REACTIVE NON-REACTI  HIV antibody     Status: None   Collection Time: 10/14/17  1:51 PM  Result Value Ref Range   HIV 1&2 Ab, 4th Generation NON-REACTIVE NON-REACTI    Comment: HIV-1 antigen and HIV-1/HIV-2 antibodies were not detected. There is no laboratory evidence of HIV infection. Marland Kitchen PLEASE NOTE: This information has been disclosed to you from records whose confidentiality may be protected by state law.  If your state requires such protection, then the state law prohibits you from making any further disclosure of the information without the specific written consent of the person to whom it pertains, or as otherwise permitted by law. A general authorization for the release of medical or other information is NOT sufficient for this purpose. . For additional information please refer to http://education.[facility].com/faq/FAQ106 (This link is being provided for informational/ educational purposes only.) . Marland Kitchen The performance of this assay has not been clinically validated in patients less than 43 years old. .   Hepatitis B surface antibody     Status: Abnormal   Collection Time: 10/14/17  1:51 PM  Result Value Ref Range   Hepatitis B-Post <5 (L) > OR = 10 mIU/mL    Comment: . Patient does not have immunity to hepatitis B virus. . For additional information, please refer to http://education.[facility].com/faq/FAQ105 (This link is being provided for  informational/ educational purposes only).   Hepatitis B core antibody, total     Status: None   Collection Time: 10/14/17  1:51 PM  Result Value Ref Range   Hep B Core Total Ab NON-REACTIVE NON-REACTI  Hepatitis B surface antigen     Status: None   Collection Time: 10/14/17  1:51 PM  Result Value Ref Range   Hepatitis B Surface Ag NON-REACTIVE NON-REACTI  Hepatitis C antibody     Status: None   Collection Time: 10/14/17  1:51 PM  Result Value Ref Range   Hepatitis C Ab NON-REACTIVE NON-REACTI   SIGNAL TO CUT-OFF 0.01 <1.00  HSV 1 antibody, IgG     Status: None   Collection Time: 10/14/17  1:51 PM  Result Value Ref Range   HSV 1 Glycoprotein G Ab, IgG <0.90 index    Comment:                           Index          Interpretation                           -----          --------------                           <0.90          Negative  0.90-1.09      Equivocal                           >1.09          Positive . This assay utilizes recombinant type-specific antigens to differentiate HSV-1 from HSV-2 infections. A positive result cannot distinguish between recent and past infection. If recent HSV infection is suspected but the results are negative or equivocal, the assay should be repeated in 4-6 weeks. The performance characteristics of the assay have not been established for pediatric populations, immunocompromised patients, or neonatal screening.   HSV 2 antibody, IgG     Status: None   Collection Time: 10/14/17  1:51 PM  Result Value Ref Range   HSV 2 Glycoprotein G Ab, IgG <0.90 index    Comment:                           Index          Interpretation                           -----          --------------                           <0.90          Negative                           0.90-1.09      Equivocal                           >1.09          Positive . This assay utilizes recombinant type-specific antigens to differentiate HSV-1 from HSV-2  infections. A positive result cannot distinguish between recent and past infection. If recent HSV infection is suspected but the results are negative or equivocal, the assay should be repeated in 4-6 weeks. The performance characteristics of the assay have not been established for pediatric populations, immunocompromised patients, or neonatal screening.    Objective  Body mass index is 23.6 kg/m. Wt Readings from Last 3 Encounters:  11/25/17 174 lb (78.9 kg) (77 %, Z= 0.74)*  11/11/17 175 lb 3.2 oz (79.5 kg) (78 %, Z= 0.78)*  06/24/17 169 lb 12.8 oz (77 kg) (74 %, Z= 0.66)*   * Growth percentiles are based on CDC (Boys, 2-20 Years) data.   Temp Readings from Last 3 Encounters:  11/25/17 99.4 F (37.4 C) (Oral)  06/24/17 97.9 F (36.6 C) (Oral)  02/24/17 98.7 F (37.1 C) (Oral)   BP Readings from Last 3 Encounters:  11/25/17 102/60 (2 %, Z = -2.07 /  11 %, Z = -1.24)*  11/11/17 134/80 (85 %, Z = 1.05 /  83 %, Z = 0.94)*  06/24/17 116/70   *BP percentiles are based on the August 2017 AAP Clinical Practice Guideline for boys   Pulse Readings from Last 3 Encounters:  11/25/17 97  11/11/17 98  06/24/17 73   O2 sat room air 97%  Physical Exam  Constitutional: He is oriented to person, place, and time and well-developed, well-nourished, and in no distress. Vital signs are normal.  HENT:  Head: Normocephalic and atraumatic.  Mouth/Throat: Oropharynx is clear and  moist and mucous membranes are normal.  Eyes: Conjunctivae are normal. Pupils are equal, round, and reactive to light.  Cardiovascular: Normal rate, regular rhythm and normal heart sounds.  Pulmonary/Chest: Effort normal and breath sounds normal.  Neurological: He is alert and oriented to person, place, and time. Gait normal. Gait normal.  Skin: Skin is warm and dry.  Psychiatric: Mood, memory, affect and judgment normal.  Nursing note and vitals reviewed.   Assessment   1. Migraines improved  2.  Anxiety/insomnia improved  3. Constipation sl improved  4. Polycythemia ? Etiology high altitude prior to lab draw  5. HM Plan  1. Cont meds f/u neurology  2. Increase Buspar to 10 mg bid  Hold on therapy referral  3. Cont supportive vare  4. Repeat CBC today  5.  Given hep B vaccine today f/u in 2 months and 6 months  Due for Tdap if really had in 2005  Check vx database for Tdap (had 2005) and HPV vaccines (had) STD neg  Dermatology appt sch Provider: Dr. French Ana McLean-Scocuzza-[facility]

## 2017-11-25 NOTE — Patient Instructions (Signed)
I am glad you are doing well  Please schedule nurse visit for 2 months then 6 months for hepatitis B vaccine and see me in  3-6 months  Take care  Generalized Anxiety Disorder, Adult Generalized anxiety disorder (GAD) is a mental health disorder. People with this condition constantly worry about everyday events. Unlike normal anxiety, worry related to GAD is not triggered by a specific event. These worries also do not fade or get better with time. GAD interferes with life functions, including relationships, work, and school. GAD can vary from mild to severe. People with severe GAD can have intense waves of anxiety with physical symptoms (panic attacks). What are the causes? The exact cause of GAD is not known. What increases the risk? This condition is more likely to develop in:  Women.  People who have a family history of anxiety disorders.  People who are very shy.  People who experience very stressful life events, such as the death of a loved one.  People who have a very stressful family environment.  What are the signs or symptoms? People with GAD often worry excessively about many things in their lives, such as their health and family. They may also be overly concerned about:  Doing well at work.  Being on time.  Natural disasters.  Friendships.  Physical symptoms of GAD include:  Fatigue.  Muscle tension or having muscle twitches.  Trembling or feeling shaky.  Being easily startled.  Feeling like your heart is pounding or racing.  Feeling out of breath or like you cannot take a deep breath.  Having trouble falling asleep or staying asleep.  Sweating.  Nausea, diarrhea, or irritable bowel syndrome (IBS).  Headaches.  Trouble concentrating or remembering facts.  Restlessness.  Irritability.  How is this diagnosed? Your health care provider can diagnose GAD based on your symptoms and medical history. You will also have a physical exam. The health care  provider will ask specific questions about your symptoms, including how severe they are, when they started, and if they come and go. Your health care provider may ask you about your use of alcohol or drugs, including prescription medicines. Your health care provider may refer you to a mental health specialist for further evaluation. Your health care provider will do a thorough examination and may perform additional tests to rule out other possible causes of your symptoms. To be diagnosed with GAD, a person must have anxiety that:  Is out of his or her control.  Affects several different aspects of his or her life, such as work and relationships.  Causes distress that makes him or her unable to take part in normal activities.  Includes at least three physical symptoms of GAD, such as restlessness, fatigue, trouble concentrating, irritability, muscle tension, or sleep problems.  Before your health care provider can confirm a diagnosis of GAD, these symptoms must be present more days than they are not, and they must last for six months or longer. How is this treated? The following therapies are usually used to treat GAD:  Medicine. Antidepressant medicine is usually prescribed for long-term daily control. Antianxiety medicines may be added in severe cases, especially when panic attacks occur.  Talk therapy (psychotherapy). Certain types of talk therapy can be helpful in treating GAD by providing support, education, and guidance. Options include: ? Cognitive behavioral therapy (CBT). People learn coping skills and techniques to ease their anxiety. They learn to identify unrealistic or negative thoughts and behaviors and to replace them with  positive ones. ? Acceptance and commitment therapy (ACT). This treatment teaches people how to be mindful as a way to cope with unwanted thoughts and feelings. ? Biofeedback. This process trains you to manage your body's response (physiological response) through  breathing techniques and relaxation methods. You will work with a therapist while machines are used to monitor your physical symptoms.  Stress management techniques. These include yoga, meditation, and exercise.  A mental health specialist can help determine which treatment is best for you. Some people see improvement with one type of therapy. However, other people require a combination of therapies. Follow these instructions at home:  Take over-the-counter and prescription medicines only as told by your health care provider.  Try to maintain a normal routine.  Try to anticipate stressful situations and allow extra time to manage them.  Practice any stress management or self-calming techniques as taught by your health care provider.  Do not punish yourself for setbacks or for not making progress.  Try to recognize your accomplishments, even if they are small.  Keep all follow-up visits as told by your health care provider. This is important. Contact a health care provider if:  Your symptoms do not get better.  Your symptoms get worse.  You have signs of depression, such as: ? A persistently sad, cranky, or irritable mood. ? Loss of enjoyment in activities that used to bring you joy. ? Change in weight or eating. ? Changes in sleeping habits. ? Avoiding friends or family members. ? Loss of energy for normal tasks. ? Feelings of guilt or worthlessness. Get help right away if:  You have serious thoughts about hurting yourself or others. If you ever feel like you may hurt yourself or others, or have thoughts about taking your own life, get help right away. You can go to your nearest emergency department or call:  Your local emergency services (911 in the [place]  A suicide crisis helpline, such as the National Suicide Prevention Lifeline at (931)155-8872. This is open 24 hours a day.  Summary  Generalized anxiety disorder (GAD) is a mental health disorder that involves worry  that is not triggered by a specific event.  People with GAD often worry excessively about many things in their lives, such as their health and family.  GAD may cause physical symptoms such as restlessness, trouble concentrating, sleep problems, frequent sweating, nausea, diarrhea, headaches, and trembling or muscle twitching.  A mental health specialist can help determine which treatment is best for you. Some people see improvement with one type of therapy. However, other people require a combination of therapies. This information is not intended to replace advice given to you by your health care provider. Make sure you discuss any questions you have with your health care provider. Document Released: 02/08/2013 Document Revised: 09/03/2016 Document Reviewed: 09/03/2016 Elsevier Interactive Patient Education  [employer].

## 2017-11-25 NOTE — Progress Notes (Signed)
Pre visit review using our clinic review tool, if applicable. No additional management support is needed unless otherwise documented below in the visit note. 

## 2017-11-27 DIAGNOSIS — G4700 Insomnia, unspecified: Secondary | ICD-10-CM | POA: Insufficient documentation

## 2017-12-04 ENCOUNTER — Other Ambulatory Visit: Payer: Self-pay | Admitting: Internal Medicine

## 2017-12-04 DIAGNOSIS — D751 Secondary polycythemia: Secondary | ICD-10-CM

## 2018-01-27 ENCOUNTER — Ambulatory Visit (INDEPENDENT_AMBULATORY_CARE_PROVIDER_SITE_OTHER): Payer: No Typology Code available for payment source

## 2018-01-27 DIAGNOSIS — Z23 Encounter for immunization: Secondary | ICD-10-CM | POA: Diagnosis not present

## 2018-01-27 DIAGNOSIS — Z0000 Encounter for general adult medical examination without abnormal findings: Secondary | ICD-10-CM

## 2018-01-27 NOTE — Progress Notes (Signed)
As we discussed, pt needs to be rescheduled for third hepatitis B vaccine.  He is currently schedule for 07/2018.  Hepatitis b vaccines are given (0 month, 1 month and 6 months (after first vaccine)).  Any questions or problems, see me.    Dr Lorin PicketScott

## 2018-01-27 NOTE — Progress Notes (Signed)
Hep B given in left deltoid tolerated well 2nd shot of 3 will come back and get next one in six months.  Reviewed above.  As we discussed pt needs to be scheduled for f/u third hepatitis B vaccine 6 months from original vaccine. (zero month, one month and 6 months from original).  Please change appt and notify pt.  Pt currently scheduled for 07/2018. Thanks.    Dr Lorin PicketScott

## 2018-02-18 ENCOUNTER — Ambulatory Visit (INDEPENDENT_AMBULATORY_CARE_PROVIDER_SITE_OTHER): Payer: No Typology Code available for payment source | Admitting: Neurology

## 2018-02-18 ENCOUNTER — Encounter: Payer: Self-pay | Admitting: Neurology

## 2018-02-18 VITALS — BP 120/82 | HR 95 | Ht 72.0 in | Wt 175.0 lb

## 2018-02-18 DIAGNOSIS — G43009 Migraine without aura, not intractable, without status migrainosus: Secondary | ICD-10-CM

## 2018-02-18 NOTE — Patient Instructions (Signed)
1.  Continue nortriptyline 25mg  at bedtime 2.  Use ibuprofen as needed, limited to no more than 2 days out of week. 3.  Follow up in 5 months.

## 2018-02-18 NOTE — Progress Notes (Signed)
NEUROLOGY FOLLOW UP OFFICE NOTE  Donald Lane [id number]  HISTORY OF PRESENT ILLNESS: Donald Lane is a 22 year old male with migraines and anxiety who follows up for migraine.  UPDATE: Last visit, he was started on nortriptyline.  Headaches have been much improved.  He has had a total of maybe 3 migraines over past 3 months.  Only one was a breakthrough migraine.  The other two occurred in setting of having missed doses of nortriptyline.  He notes that he is a little more drowsy in the morning since starting nortriptyline.  Also, his mood is a little more "dulled" but nothing that would prevent him from continuing the medication. intensity:  severe Duration:  1 to 2 hours Frequency:  Total of 3 over past 3 months. Rescue protocol:  First ibuprofen (cannot tell if headache will progress to migraine.  Second, sumatriptan 25mg  (in 30 minutes if needed, repeat dose once) Current NSAIDS:  ibuprofen  Current analgesics:  no Current triptans:  sumatriptan 50mg  to 100mg  Current anti-emetic:  no Current muscle relaxants:  no Current anti-anxiolytic:  buspirone Current sleep aide:  no Current Antihypertensive medications:  no Current Antidepressant medications:  nortriptyline 25mg  Current Anticonvulsant medications:  no Current Vitamins/Herbal/Supplements:  no Current Antihistamines/Decongestants:  no Other therapy:  no     Caffeine:  Coffee once a week, tea Alcohol:  no Smoker:  no Diet:  Increased water intake, little soda intake Exercise:  no Depression/anxiety:  anxiety Sleep hygiene:  Sleeps well but does not follow routine sleep schedule   HISTORY: Onset:  He has had "headaches" since childhood but started having "migraines" for the past year Location:  Migraines:  Holocephalic/above right eye; Headaches:  Bi-temporal Quality:  Migraines:  Pounding; Headaches:  pressure Initial Intensity:  Migraines:  Severe; Headaches: dull-moderate Aura:   no Prodrome:  no Postdrome:  no Associated symptoms:  Migraines: photophobia, phonophobia.  Sometimes nausea, rarely vomiting.  No osmophobia, autonomic symptoms or visual disturbance.  He denies new worse headache of his life or causing him to wake up from sleep Initial Duration:  3 to 4 hours Initial Frequency:  Migraines:  1 to 2 days a month; Headaches: 3 days a week Initial Frequency of abortive medication: ibuprofen once a week Triggers/exacerbating factors:  stress Relieving factors:  rest Activity:  Migraines aggravated.  Headaches are not aggravated.   Past NSAIDS:  ASA Past analgesics:  Excedrin Past abortive triptans:  no Past muscle relaxants:  no Past anti-emetic:  no Past antihypertensive medications:  no Past antidepressant medications:  no Past anticonvulsant medications:  no Past vitamins/Herbal/Supplements:  Essential oils Past antihistamines/decongestants:  no Other past therapies:  no   Family history of headache:  mother, father, maternal cousins   MRI of brain without contrast from 11/01/17 was personally reviewed and is unremarkable.  PAST MEDICAL HISTORY: Past Medical History:  Diagnosis Date  . Anxiety   . Frequent headaches     MEDICATIONS: Current Outpatient Medications on File Prior to Visit  Medication Sig Dispense Refill  . aspirin-acetaminophen-caffeine (MIGRAINE FORMULA) 250-250-65 MG tablet Take by mouth every 6 (six) hours as needed for headache. Excedrin migraine    . busPIRone (BUSPAR) 10 MG tablet Take 1 tablet (10 mg total) by mouth 2 (two) times daily. 60 tablet 5  . ibuprofen (ADVIL,MOTRIN) 200 MG tablet Take 200 mg by mouth every 6 (six) hours as needed.    . Lidocaine-Glycerin (PREPARATION H) 5-14.4 % CREA Place rectally.    . nortriptyline (  PAMELOR) 25 MG capsule Take 1 capsule (25 mg total) by mouth at bedtime. 30 capsule 3  . phenylephrine-shark liver oil-mineral oil-petrolatum (PREPARATION H) 0.25-14-74.9 % rectal ointment Place 1  application rectally 2 (two) times daily as needed for hemorrhoids.    . SUMAtriptan (IMITREX) 100 MG tablet Take 1 tablet earliest onset of headache.  May repeat x1 in 2 hours if headache persists or recurs.  Do not exceed 2 tablets in 24 hours 10 tablet 2   No current facility-administered medications on file prior to visit.     ALLERGIES: No Known Allergies  FAMILY HISTORY: Family History  Problem Relation Age of Onset  . Alcohol abuse Paternal Aunt   . Diabetes Father   . Diabetes Maternal Grandmother   . Migraines Maternal Grandfather   . Cancer Paternal Grandfather     SOCIAL HISTORY: Social History   Socioeconomic History  . Marital status: Single    Spouse name: Not on file  . Number of children: Not on file  . Years of education: Not on file  . Highest education level: 12th grade  Occupational History  . 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 Financial resource strain: Not on file  . Food insecurity:    Worry: Not on file    Inability: Not on file  . Transportation needs:    Medical: Not on file    Non-medical: Not on file  Tobacco Use  . Smoking status: Never Smoker  . Smokeless tobacco: Never Used  Substance and Sexual Activity  . Alcohol use: No    Alcohol/week: 0.0 oz  . Drug use: No  . Sexual activity: Not on file  Lifestyle  . Physical activity:    Days per week: Not on file    Minutes per session: Not on file  . Stress: Not on file  Relationships  . Social connections:    Talks on phone: Not on file    Gets together: Not on file    Attends religious service: Not on file    Active member of club or organization: Not on file    Attends meetings of clubs or organizations: Not on file    Relationship status: Not on file  . Intimate partner violence:    Fear of current or ex partner: Not on file    Emotionally abused: Not on file    Physically abused: Not on file    Forced sexual activity: Not on file  Other Topics  Concern  . Not on file  Social History Narrative   Single, lives with parents in a 2 story home. Has 3 dogs and 1 cat. Has an occasional coffee or soda. Drinks approximately 12oz of hot tea a day.    REVIEW OF SYSTEMS: Constitutional: No fevers, chills, or sweats, no generalized fatigue, change in appetite Eyes: No visual changes, double vision, eye pain Ear, nose and throat: No hearing loss, ear pain, nasal congestion, sore throat Cardiovascular: No chest pain, palpitations Respiratory:  No shortness of breath at rest or with exertion, wheezes GastrointestinaI: No nausea, vomiting, diarrhea, abdominal pain, fecal incontinence Genitourinary:  No dysuria, urinary retention or frequency Musculoskeletal:  No neck pain, back pain Integumentary: No rash, pruritus, skin lesions Neurological: as above Psychiatric: No depression, insomnia, anxiety Endocrine: No palpitations, fatigue, diaphoresis, mood swings, change in appetite, change in weight, increased thirst Hematologic/Lymphatic:  No purpura, petechiae. Allergic/Immunologic: no itchy/runny eyes, nasal congestion, recent allergic reactions, rashes  PHYSICAL  EXAM: Vitals:   02/18/18 1431  BP: 120/82  Pulse: 95  SpO2: 98%   General: No acute distress.  Patient appears well-groomed.  normal body habitus. Head:  Normocephalic/atraumatic Eyes:  Fundi examined but not visualized Neck: supple, no paraspinal tenderness, full range of motion Heart:  Regular rate and rhythm Lungs:  Clear to auscultation bilaterally Back: No paraspinal tenderness Neurological Exam: alert and oriented to person, place, and time. Attention span and concentration intact, recent and remote memory intact, fund of knowledge intact.  Speech fluent and not dysarthric, language intact.  CN II-XII intact. Bulk and tone normal, muscle strength 5/5 throughout.  Sensation to light touch, temperature and vibration intact.  Deep tendon reflexes 2+ throughout, toes downgoing.   Finger to nose and heel to shin testing intact.  Gait normal, Romberg negative.  IMPRESSION: Migraine without aura, not intractable, without status migrainosus  PLAN: Continue nortriptyline 25mg  at bedtime Ibuprofen as needed, limited to no more than 2 days out of week Headache diary Follow up in 5 months.  Shon MilletAdam Jaffe, DO  CC: Quentin Oreracy McLean-Scocuzza, MD

## 2018-04-03 ENCOUNTER — Encounter: Payer: Self-pay | Admitting: Internal Medicine

## 2018-04-03 ENCOUNTER — Other Ambulatory Visit: Payer: Self-pay

## 2018-04-03 ENCOUNTER — Other Ambulatory Visit: Payer: Self-pay | Admitting: Internal Medicine

## 2018-04-03 ENCOUNTER — Encounter: Payer: Self-pay | Admitting: Neurology

## 2018-04-03 DIAGNOSIS — F419 Anxiety disorder, unspecified: Secondary | ICD-10-CM

## 2018-04-03 MED ORDER — NORTRIPTYLINE HCL 25 MG PO CAPS: 25.0000 mg | ORAL_CAPSULE | Freq: Every day | ORAL | 3 refills | Status: DC

## 2018-04-03 MED ORDER — BUSPIRONE HCL 10 MG PO TABS: 10.0000 mg | ORAL_TABLET | Freq: Two times a day (BID) | ORAL | 11 refills | Status: DC

## 2018-04-07 ENCOUNTER — Encounter: Payer: Self-pay | Admitting: Internal Medicine

## 2018-04-28 ENCOUNTER — Other Ambulatory Visit: Payer: Self-pay | Admitting: Internal Medicine

## 2018-04-28 ENCOUNTER — Other Ambulatory Visit: Payer: Self-pay | Admitting: Neurology

## 2018-04-28 DIAGNOSIS — F419 Anxiety disorder, unspecified: Secondary | ICD-10-CM

## 2018-05-25 ENCOUNTER — Other Ambulatory Visit: Payer: Self-pay

## 2018-05-25 ENCOUNTER — Encounter: Payer: Self-pay | Admitting: Internal Medicine

## 2018-05-25 ENCOUNTER — Ambulatory Visit: Payer: PRIVATE HEALTH INSURANCE | Admitting: Internal Medicine

## 2018-05-25 VITALS — BP 126/92 | HR 93 | Temp 98.1°F | Ht 72.0 in | Wt 181.2 lb

## 2018-05-25 DIAGNOSIS — T753XXA Motion sickness, initial encounter: Secondary | ICD-10-CM | POA: Insufficient documentation

## 2018-05-25 DIAGNOSIS — Z1159 Encounter for screening for other viral diseases: Secondary | ICD-10-CM

## 2018-05-25 DIAGNOSIS — D751 Secondary polycythemia: Secondary | ICD-10-CM

## 2018-05-25 DIAGNOSIS — F419 Anxiety disorder, unspecified: Secondary | ICD-10-CM

## 2018-05-25 DIAGNOSIS — Z23 Encounter for immunization: Secondary | ICD-10-CM | POA: Diagnosis not present

## 2018-05-25 DIAGNOSIS — G43009 Migraine without aura, not intractable, without status migrainosus: Secondary | ICD-10-CM

## 2018-05-25 DIAGNOSIS — Z0184 Encounter for antibody response examination: Secondary | ICD-10-CM | POA: Diagnosis not present

## 2018-05-25 DIAGNOSIS — Z1283 Encounter for screening for malignant neoplasm of skin: Secondary | ICD-10-CM

## 2018-05-25 DIAGNOSIS — G43909 Migraine, unspecified, not intractable, without status migrainosus: Secondary | ICD-10-CM

## 2018-05-25 LAB — CBC WITH DIFFERENTIAL/PLATELET
BASOS PCT: 0.5 % (ref 0.0–3.0)
Basophils Absolute: 0 10*3/uL (ref 0.0–0.1)
Eosinophils Absolute: 0.1 10*3/uL (ref 0.0–0.7)
Eosinophils Relative: 1.3 % (ref 0.0–5.0)
HEMATOCRIT: 49.1 % — AB (ref 36.0–49.0)
HEMOGLOBIN: 17.1 g/dL — AB (ref 12.0–16.0)
LYMPHS PCT: 42.4 % (ref 24.0–48.0)
Lymphs Abs: 2.2 10*3/uL (ref 0.7–4.0)
MCHC: 34.8 g/dL (ref 31.0–37.0)
MCV: 89.1 fl (ref 78.0–98.0)
MONOS PCT: 12 % (ref 3.0–12.0)
Monocytes Absolute: 0.6 10*3/uL (ref 0.1–1.0)
NEUTROS ABS: 2.2 10*3/uL (ref 1.4–7.7)
Neutrophils Relative %: 43.8 % (ref 43.0–71.0)
PLATELETS: 166 10*3/uL (ref 150.0–575.0)
RBC: 5.51 Mil/uL (ref 3.80–5.70)
RDW: 12.4 % (ref 11.4–15.5)
WBC: 5.1 10*3/uL (ref 4.5–13.5)

## 2018-05-25 MED ORDER — SUMATRIPTAN SUCCINATE 100 MG PO TABS: ORAL_TABLET | ORAL | 5 refills | Status: DC

## 2018-05-25 MED ORDER — SCOPOLAMINE 1 MG/3DAYS TD PT72: 1.0000 | MEDICATED_PATCH | TRANSDERMAL | 0 refills | Status: DC

## 2018-05-25 NOTE — Addendum Note (Signed)
Addended by: Martie LeeFOY, PATINA C on: 05/25/2018 01:57 PM   Modules accepted: Orders

## 2018-05-25 NOTE — Progress Notes (Addendum)
Chief Complaint  Patient presents with  . Follow-up   F/u  1. Migraines controlled on nortriptyline 25 mg qhs and needs refill imitrex  2. Wants motion sickness tx going on cruise to [place] with family and GF 3. 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  HENT: Negative for hearing loss.   Eyes: Negative for blurred vision.  Respiratory: Negative for shortness of breath.   Cardiovascular: Negative for chest pain.  Gastrointestinal: Negative for constipation.  Skin: Negative for rash.  Neurological:       +h/o motion sickness   Psychiatric/Behavioral:       Anxiety controlled     Past Medical History:  Diagnosis Date  . Anxiety   . Frequent headaches    Past Surgical History:  Procedure Laterality Date  . TONSILLECTOMY AND ADENOIDECTOMY  2008  . WISDOM TOOTH EXTRACTION     Family History  Problem Relation Age of Onset  . Alcohol abuse Paternal Aunt   . Diabetes Father   . Diabetes Maternal Grandmother   . Migraines Maternal Grandfather   . Cancer Paternal Grandfather    Social History   Socioeconomic History  . Marital status: Single    Spouse name: Not on file  . Number of children: Not on file  . Years of education: Not on file  . Highest education level: 12th grade  Occupational History  . 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 Financial resource strain: Not on file  . Food insecurity:    Worry: Not on file    Inability: Not on file  . Transportation needs:    Medical: Not on file    Non-medical: Not on file  Tobacco Use  . Smoking status: Never Smoker  . Smokeless tobacco: Never Used  Substance and Sexual Activity  . Alcohol use: No    Alcohol/week: 0.0 oz  . Drug use: No  . Sexual activity: Not on file  Lifestyle  . Physical activity:    Days per week: Not on  file    Minutes per session: Not on file  . Stress: Not on file  Relationships  . Social connections:    Talks on phone: Not on file    Gets together: Not on file    Attends religious service: Not on file    Active member of club or organization: Not on file    Attends meetings of clubs or organizations: Not on file    Relationship status: Not on file  . Intimate partner violence:    Fear of current or ex partner: Not on file    Emotionally abused: Not on file    Physically abused: Not on file    Forced sexual activity: Not on file  Other Topics Concern  . Not on file  Social History Narrative   Single, lives with parents in a 2 story home. Has 3 dogs and 1 cat. Has an occasional coffee or soda. Drinks approximately 12oz of hot tea a day.   Current Meds  Medication Sig  . aspirin-acetaminophen-caffeine (MIGRAINE FORMULA) 250-250-65 MG tablet Take by mouth every 6 (six) hours as needed for headache. Excedrin migraine  . busPIRone (BUSPAR) 10 MG tablet TAKE 1 TABLET BY MOUTH TWICE A DAY  . ibuprofen (ADVIL,MOTRIN) 200 MG tablet Take 200 mg  by mouth every 6 (six) hours as needed.  . nortriptyline (PAMELOR) 25 MG capsule Take 1 capsule (25 mg total) by mouth at bedtime.  . nortriptyline (PAMELOR) 25 MG capsule TAKE 1 CAPSULE (25 MG TOTAL) BY MOUTH AT BEDTIME.  . SUMAtriptan (IMITREX) 100 MG tablet Take 1 tablet earliest onset of headache.  May repeat x1 in 2 hours if headache persists or recurs.  Do not exceed 2 tablets in 24 hours   No Known Allergies No results found for this or any previous visit (from the past 2160 hour(s)). Objective  Body mass index is 24.58 kg/m. Wt Readings from Last 3 Encounters:  05/25/18 181 lb 3.2 oz (82.2 kg) (82 %, Z= 0.90)*  02/18/18 175 lb (79.4 kg) (77 %, Z= 0.74)*  11/25/17 174 lb (78.9 kg) (77 %, Z= 0.74)*   * Growth percentiles are based on CDC (Boys, 2-20 Years) data.   Temp Readings from Last 3 Encounters:  05/25/18 98.1 F (36.7 C)   11/25/17 99.4 F (37.4 C) (Oral)  06/24/17 97.9 F (36.6 C) (Oral)   BP Readings from Last 3 Encounters:  05/25/18 (!) 126/92  02/18/18 120/82  11/25/17 102/60   Pulse Readings from Last 3 Encounters:  05/25/18 93  02/18/18 95  11/25/17 97    Physical Exam  Constitutional: He is oriented to person, place, and time. Vital signs are normal. He appears well-developed and well-nourished. He is cooperative.  HENT:  Head: Normocephalic and atraumatic.  Mouth/Throat: Oropharynx is clear and moist and mucous membranes are normal.  Eyes: Pupils are equal, round, and reactive to light. Conjunctivae are normal.  Cardiovascular: Normal rate, regular rhythm and normal heart sounds.  Pulmonary/Chest: Effort normal and breath sounds normal.  Neurological: He is alert and oriented to person, place, and time. Gait normal.  Skin: Skin is warm, dry and intact.  Psychiatric: He has a normal mood and affect. His speech is normal and behavior is normal. Judgment and thought content normal. Cognition and memory are normal.  Nursing note and vitals reviewed.   Assessment   1. Migraines  2. Motion sickness  3. Polycythemia  4. Anxiety controlled  5. HM Plan   1. Cont nortriptyline  Refilled imitrex  2. scopalamine patch q3 days 7 day trip  Disc sea band and otc dramamine  3. Cbc repeat with Jak 2 denies apnea  4. Cont meds controlled  5.  Given hep B vaccine today 3/3   Given Tdap today CMA Patina gave pt Td not Tdap will need Tdap in future  HPV vaccines (had) STD neg  Dermatology [place] [facility] referred today on [facility]  Check CBC, MMR,Jak 2 today    Provider: Dr. Olivia Mackie McLean-Scocuzza-Internal Medicine

## 2018-05-25 NOTE — Patient Instructions (Addendum)
Reschedule dermatology appt Try [employer] wrist bracelet for motion sickness or Dramamine over the counter chewable   Try Scopolamine patch  F/u in 6 months sooner if needed  We will let you know about your labs     Scopolamine skin patches What is this medicine? SCOPOLAMINE (skoe POL a meen) is used to prevent nausea and vomiting caused by motion sickness, anesthesia and surgery. This medicine may be used for other purposes; ask your health care provider or pharmacist if you have questions. COMMON BRAND NAME(S): Transderm Scop What should I tell my health care provider before I take this medicine? They need to know if you have any of these conditions: -glaucoma -kidney or liver disease -an unusual or allergic reaction (especially skin allergy) to scopolamine, atropine, other medicines, foods, dyes, or preservatives -pregnant or trying to get pregnant -breast-feeding How should I use this medicine? This medicine is for external use only. Follow the directions on the prescription label. One patch contains enough medicine to prevent motion sickness for up to 3 days. Apply the patch at least 4 hours before you need it and only wear one disc at a time. Choose an area behind the ear, that is clean, dry, hairless and free from any cuts or irritation. Wipe the area with a clean dry tissue. Peel off the plastic backing of the skin patch, trying not to touch the adhesive side with your hands. Do not cut the patches. Firmly apply to the area you have chosen, with the metallic side of the patch to the skin and the tan-colored side showing. Once firmly in place, wash your hands well with soap and water. Remove the disc after 3 days, or sooner if you no longer need it. After removing the patch, wash your hands and the area behind your ear thoroughly with soap and water. The patch will still contain some medicine after use. To avoid accidental contact or ingestion by children or pets, fold the used patch in  half with the sticky side together and throw away in the trash out of the reach of children and pets. If you need to use a second patch after you remove the first, place it behind the other ear. Talk to your pediatrician regarding the use of this medicine in children. Special care may be needed. Overdosage: If you think you have taken too much of this medicine contact a poison control center or emergency room at once. NOTE: This medicine is only for you. Do not share this medicine with others. What if I miss a dose? Make sure you apply the patch at least 4 hours before you need it. You can apply it the night before traveling. What may interact with this medicine? -benztropine -bethanechol -medicines for anxiety or sleeping problems like diazepam or temazepam -medicines for hay fever and other allergies -medicines for mental depression -muscle relaxants This list may not describe all possible interactions. Give your health care provider a list of all the medicines, herbs, non-prescription drugs, or dietary supplements you use. Also tell them if you smoke, drink alcohol, or use illegal drugs. Some items may interact with your medicine. What should I watch for while using this medicine? Keep the patch dry, if possible, to prevent it from falling off. Limited contact with water, however, as in bathing or swimming, will not affect the system. If the patch falls off, throw it away and put a new one behind the other ear. You may get drowsy or dizzy. Do not drive,  use machinery, or do anything that needs mental alertness until you know how this medicine affects you. Do not stand or sit up quickly, especially if you are an older patient. This reduces the risk of dizzy or fainting spells. Alcohol may interfere with the effect of this medicine. Avoid alcoholic drinks. Your mouth may get dry. Chewing sugarless gum or sucking hard candy, and drinking plenty of water may help. Contact your doctor if the problem  does not go away or is severe. This medicine may cause dry eyes and blurred vision. If you wear contact lenses you may feel some discomfort. Lubricating drops may help. See your eye doctor if the problem does not go away or is severe. If you are going to have a magnetic resonance imaging (MRI) procedure, tell your MRI technician if you have this patch on your body. It must be removed before a MRI. What side effects may I notice from receiving this medicine? Side effects that you should report to your doctor or health care professional as soon as possible: -agitation, nervousness, confusion -blurred vision and other eye problems -dizziness, drowsiness -eye pain or redness in the whites of the eye -hallucinations -pain or difficulty passing urine -skin rash, itching -vomiting Side effects that usually do not require medical attention (report to your doctor or health care professional if they continue or are bothersome): -headache -nausea This list may not describe all possible side effects. Call your doctor for medical advice about side effects. You may report side effects to FDA at 1-800-FDA-1088. Where should I keep my medicine? Keep out of the reach of children. Store at room temperature between 20 and 25 degrees C (68 and 77 degrees F). Throw away any unused medicine after the expiration date. When you remove a patch, fold it and throw it in the trash as described above. NOTE: This sheet is a summary. It may not cover all possible information. If you have questions about this medicine, talk to your doctor, pharmacist, or health care provider.  2018 Elsevier/Gold Standard (2012-03-12 13:31:48)   Motion Sickness Motion sickness is an unpleasant, temporary feeling of dizziness and nausea that occurs when a person is traveling in a moving vehicle. The person may also have abdominal pain, sweating, and paleness. This condition can occur while you travel in a boat, a car, or an airplane, or  even on an amusement park ride. The symptoms of motion sickness usually get better after the motion or traveling stops, but problems may persist for hours or days. What are the causes? This condition may be caused by overstimulation of a part of the inner ear that is called the semicircular canals. The semicircular canals help you to keep your balance by sending signals to your brain about movement. If these canals are stimulated too much from motion of your body, you can develop motion sickness. This condition can also occur if your brain gets conflicting signals from the various motion sensors in your body. The effects of motion sickness may be increased by stress, dehydration, other illnesses, or drinking too much alcohol. What increases the risk? This condition is more likely to develop in:  Children who are 70-36 years old.  Women, especially pregnant women or women who take birth control pills.  People who get migraine headaches.  People who have inner ear disorders.  People who take certain medicines.  What are the signs or symptoms? Symptoms of this condition include:  Nausea.  Dizziness.  Vomiting.  Sweating.  Pain in  the abdomen.  Being unsteady when walking.  Paleness.  How is this diagnosed? This condition is diagnosed based on a physical exam, your medical history, and your description of the symptoms that you have while traveling or moving. How is this treated? For most people, symptoms fade quickly after the motion stops. A health care provider may recommend or prescribe medicine to help prevent motion sickness. In some cases, this may be in the form of a patch that is placed behind the ear. Avoiding certain things or using certain techniques before or during travel can also help to prevent episodes of motion sickness. Follow these instructions at home:  Take medicines only as directed by your health care provider.  If you use a motion sickness patch, wash your  hands after you put the patch on. Touching your hands to your eyes after using the patch can enlarge (dilate) your pupils for 1-2 days and disturb your vision.  Drink enough fluid to keep your urine clear or pale yellow. You need to stay well hydrated. Take small, frequent sips of liquids as needed. How is this prevented?  If possible, avoid situations that cause your motion sickness.  Do not eat large meals before or during travel. When you travel longer distances, eat small, bland meals.  Do not drink alcohol before or during travel.  Sit in an area of the vehicle where the least motion occurs. ? On an airplane, sit near the wing. Lie back in your seat if possible. ? On a boat, sit near the middle. ? When you ride in a car, sit in the front seat, not the backseat.  Breathe slowly and deeply while you ride in a moving vehicle.  Do not read or focus on nearby objects when you are riding in a moving vehicle.  Try watching the horizon or a distant object while you are in a moving vehicle. This is especially helpful when you travel in a boat. In a car, ride in the front seat and look out the front window.  Do not smoke before or during travel. Avoid areas where people are smoking.  Get some fresh air if possible. For example, open a window when you are riding in a car.  Plan ahead for travel. Talk with your health care provider about whether you should use medicines to help prevent motion sickness. Contact a health care provider if:  Your vomiting or nausea does not go away within 24 hours.  You notice blood in your vomit. The blood could be dark red, or it may look like coffee grounds.  You faint or you have severe dizziness or light-headedness when you stand up. These may be signs of dehydration.  You have a fever. Get help right away if:  You have severe pain in your abdomen or chest.  You have trouble breathing.  You have a severe headache.  You develop weakness or  numbness on one side of your body.  You have trouble speaking. This information is not intended to replace advice given to you by your health care provider. Make sure you discuss any questions you have with your health care provider. Document Released: 10/14/2005 Document Revised: 03/18/2016 Document Reviewed: 09/21/2014 Elsevier Interactive Patient Education  2018 [employer].  Hepatitis B Vaccine, Recombinant injection What is this medicine? HEPATITIS B VACCINE (hep uh TAHY tis B VAK seen) is a vaccine. It is used to prevent an infection with the hepatitis B virus. This medicine may be used for other purposes;  ask your health care provider or pharmacist if you have questions. COMMON BRAND NAME(S): Engerix-B, Recombivax HB What should I tell my health care provider before I take this medicine? They need to know if you have any of these conditions: -fever, infection -heart disease -hepatitis B infection -immune system problems -kidney disease -an unusual or allergic reaction to vaccines, yeast, other medicines, foods, dyes, or preservatives -pregnant or trying to get pregnant -breast-feeding How should I use this medicine? This vaccine is for injection into a muscle. It is given by a health care professional. A copy of Vaccine Information Statements will be given before each vaccination. Read this sheet carefully each time. The sheet may change frequently. Talk to your pediatrician regarding the use of this medicine in children. While this drug may be prescribed for children as young as newborn for selected conditions, precautions do apply. Overdosage: If you think you have taken too much of this medicine contact a poison control center or emergency room at once. NOTE: This medicine is only for you. Do not share this medicine with others. What if I miss a dose? It is important not to miss your dose. Call your doctor or health care professional if you are unable to keep an  appointment. What may interact with this medicine? -medicines that suppress your immune function like adalimumab, anakinra, infliximab -medicines to treat cancer -steroid medicines like prednisone or cortisone This list may not describe all possible interactions. Give your health care provider a list of all the medicines, herbs, non-prescription drugs, or dietary supplements you use. Also tell them if you smoke, drink alcohol, or use illegal drugs. Some items may interact with your medicine. What should I watch for while using this medicine? See your health care provider for all shots of this vaccine as directed. You must have 3 shots of this vaccine for protection from hepatitis B infection. Tell your doctor right away if you have any serious or unusual side effects after getting this vaccine. What side effects may I notice from receiving this medicine? Side effects that you should report to your doctor or health care professional as soon as possible: -allergic reactions like skin rash, itching or hives, swelling of the face, lips, or tongue -breathing problems -confused, irritated -fast, irregular heartbeat -flu-like syndrome -numb, tingling pain -seizures -unusually weak or tired Side effects that usually do not require medical attention (report to your doctor or health care professional if they continue or are bothersome): -diarrhea -fever -headache -loss of appetite -muscle pain -nausea -pain, redness, swelling, or irritation at site where injected -tiredness This list may not describe all possible side effects. Call your doctor for medical advice about side effects. You may report side effects to FDA at 1-800-FDA-1088. Where should I keep my medicine? This drug is given in a hospital or clinic and will not be stored at home. NOTE: This sheet is a summary. It may not cover all possible information. If you have questions about this medicine, talk to your doctor, pharmacist, or  health care provider.  2018 Elsevier/Gold Standard (2014-02-14 13:26:01)  DTaP Vaccine (Diphtheria, Tetanus, and Pertussis): What You Need to Know 1. Why get vaccinated? Diphtheria, tetanus, and pertussis are serious diseases caused by bacteria. Diphtheria and pertussis are spread from person to person. Tetanus enters the body through cuts or wounds. DIPHTHERIA causes a thick covering in the back of the throat.  It can lead to breathing problems, paralysis, heart failure, and even death.  TETANUS (Lockjaw) causes painful tightening  of the muscles, usually all over the body.  It can lead to "locking" of the jaw so the victim cannot open his mouth or swallow. Tetanus leads to death in up to 2 out of 10 cases.  PERTUSSIS (Whooping Cough) causes coughing spells so bad that it is hard for infants to eat, drink, or breathe. These spells can last for weeks.  It can lead to pneumonia, seizures (jerking and staring spells), brain damage, and death.  Diphtheria, tetanus, and pertussis vaccine (DTaP) can help prevent these diseases. Most children who are vaccinated with DTaP will be protected throughout childhood. Many more children would get these diseases if we stopped vaccinating. DTaP is a safer version of an older vaccine called DTP. DTP is no longer used in the [place]. 2. Who should get DTaP vaccine and when? Children should get 5 doses of DTaP vaccine, one dose at each of the following ages:  2 months  4 months  6 months  15-18 months  4-6 years  DTaP may be given at the same time as other vaccines. 3. Some children should not get DTaP vaccine or should wait  Children with minor illnesses, such as a cold, may be vaccinated. But children who are moderately or severely ill should usually wait until they recover before getting DTaP vaccine.  Any child who had a life-threatening allergic reaction after a dose of DTaP should not get another dose.  Any child who suffered a brain  or nervous system disease within 7 days after a dose of DTaP should not get another dose.  Talk with your doctor if your child: ? had a seizure or collapsed after a dose of DTaP, ? cried non-stop for 3 hours or more after a dose of DTaP, ? had a fever over 105F after a dose of DTaP. Ask your doctor for more information. Some of these children should not get another dose of pertussis vaccine, but may get a vaccine without pertussis, called DT. 4. Older children and adults DTaP is not licensed for adolescents, adults, or children 66 years of age and older. But older people still need protection. A vaccine called Tdap is similar to DTaP. A single dose of Tdap is recommended for people 11 through 20 years of age. Another vaccine, called Td, protects against tetanus and diphtheria, but not pertussis. It is recommended every 10 years. There are separate Vaccine Information Statements for these vaccines. 5. What are the risks from DTaP vaccine? Getting diphtheria, tetanus, or pertussis disease is much riskier than getting DTaP vaccine. However, a vaccine, like any medicine, is capable of causing serious problems, such as severe allergic reactions. The risk of DTaP vaccine causing serious harm, or death, is extremely small. Mild problems (common)  Fever (up to about 1 child in 4)  Redness or swelling where the shot was given (up to about 1 child in 4)  Soreness or tenderness where the shot was given (up to about 1 child in 4) These problems occur more often after the 4th and 5th doses of the DTaP series than after earlier doses. Sometimes the 4th or 5th dose of DTaP vaccine is followed by swelling of the entire arm or leg in which the shot was given, lasting 1-7 days (up to about 1 child in 30). Other mild problems include:  Fussiness (up to about 1 child in 3)  Tiredness or poor appetite (up to about 1 child in 10)  Vomiting (up to about 1 child in 50) These  problems generally occur 1-3 days  after the shot. Moderate problems (uncommon)  Seizure (jerking or staring) (about 1 child out of 14,000)  Non-stop crying, for 3 hours or more (up to about 1 child out of 1,000)  High fever, over 105F (about 1 child out of 16,000) Severe problems (very rare)  Serious allergic reaction (less than 1 out of a million doses)  Several other severe problems have been reported after DTaP vaccine. These include: ? Long-term seizures, coma, or lowered consciousness ? Permanent brain damage. These are so rare it is hard to tell if they are caused by the vaccine. Controlling fever is especially important for children who have had seizures, for any reason. It is also important if another family member has had seizures. You can reduce fever and pain by giving your child an aspirin-free pain reliever when the shot is given, and for the next 24 hours, following the package instructions. 6. What if there is a serious reaction? What should I look for? Look for anything that concerns you, such as signs of a severe allergic reaction, very high fever, or behavior changes. Signs of a severe allergic reaction can include hives, swelling of the face and throat, difficulty breathing, a fast heartbeat, dizziness, and weakness. These would start a few minutes to a few hours after the vaccination. What should I do?  If you think it is a severe allergic reaction or other emergency that can't wait, call 9-1-1 or get the person to the nearest hospital. Otherwise, call your doctor.  Afterward, the reaction should be reported to the Vaccine Adverse Event Reporting System (VAERS). Your doctor might file this report, or you can do it yourself through the VAERS web site at www.vaers.LAgents.no, or by calling 1-480 150 1708. ? VAERS is only for reporting reactions. They do not give medical advice. 7. The National Vaccine Injury Compensation Program The [employer] Vaccine Injury Compensation Program (VICP) is a federal program  that was created to compensate people who may have been injured by certain vaccines. Persons who believe they may have been injured by a vaccine can learn about the program and about filing a claim by calling 1-(636)802-4983 or visiting the VICP website at SpiritualWord.at. 8. How can I learn more?  Ask your doctor.  Call your local or state health department.  Contact the Centers for Disease Control and Prevention (CDC): ? Call 410-260-4201 (1-800-CDC-INFO) or ? Visit CDC's website at PicCapture.uy CDC DTaP Vaccine (Diphtheria, Tetanus, and Pertussis) VIS (03/13/06) This information is not intended to replace advice given to you by your health care provider. Make sure you discuss any questions you have with your health care provider. Document Released: 08/11/2006 Document Revised: 07/04/2016 Document Reviewed: 07/04/2016 Elsevier Interactive Patient Education  2017 [employer].

## 2018-05-26 LAB — MEASLES/MUMPS/RUBELLA IMMUNITY
MUMPS IGG: 34.1 [AU]/ml
RUBELLA: 2.84 {index}
Rubeola IgG: 33.9 AU/mL

## 2018-05-28 ENCOUNTER — Encounter: Payer: Self-pay | Admitting: *Deleted

## 2018-05-29 LAB — JAK2 V617F MUTATION, QUANTITATIVE: JAK2 V617F Mutation Level: 0 % mutation

## 2018-06-01 ENCOUNTER — Encounter: Payer: Self-pay | Admitting: Internal Medicine

## 2018-06-01 ENCOUNTER — Other Ambulatory Visit: Payer: Self-pay | Admitting: Internal Medicine

## 2018-06-01 DIAGNOSIS — D751 Secondary polycythemia: Secondary | ICD-10-CM

## 2018-06-05 ENCOUNTER — Other Ambulatory Visit (INDEPENDENT_AMBULATORY_CARE_PROVIDER_SITE_OTHER): Payer: PRIVATE HEALTH INSURANCE

## 2018-06-05 ENCOUNTER — Telehealth: Payer: Self-pay | Admitting: Radiology

## 2018-06-05 DIAGNOSIS — D751 Secondary polycythemia: Secondary | ICD-10-CM | POA: Diagnosis not present

## 2018-06-05 NOTE — Telephone Encounter (Signed)
FYI. Pt came in for additional blood work today and while drawing pt, noticed a large bruise on the medial aspect of his right elbow. Pt stated he had blood drawn last week and a large bruise appeared. Pt denies any pain or discomfort at this time.

## 2018-06-05 NOTE — Telephone Encounter (Signed)
Mychart message has been sent to inform patient. 

## 2018-06-05 NOTE — Telephone Encounter (Signed)
He could have bruise from lab draw should resolve in 2 weeks if not getting better let me know   TMS

## 2018-06-05 NOTE — Addendum Note (Signed)
Addended by: Penne LashWIGGINS, Destini Cambre N on: 06/05/2018 03:18 PM   Modules accepted: Orders

## 2018-06-07 ENCOUNTER — Other Ambulatory Visit: Payer: Self-pay | Admitting: Internal Medicine

## 2018-06-07 ENCOUNTER — Encounter: Payer: Self-pay | Admitting: Internal Medicine

## 2018-06-07 DIAGNOSIS — D751 Secondary polycythemia: Secondary | ICD-10-CM

## 2018-06-07 LAB — STATUS REPORT

## 2018-06-07 NOTE — Progress Notes (Signed)
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 Denies snoring with apnea.

## 2018-06-08 LAB — IRON,TIBC AND FERRITIN PANEL
Ferritin: 67 ng/mL (ref 16–124)
Iron Saturation: 51 % (ref 15–55)
Iron: 154 ug/dL (ref 38–169)
TIBC: 300 ug/dL (ref 250–450)
UIBC: 146 ug/dL (ref 111–343)

## 2018-06-08 LAB — TRANSFERRIN SATURATION

## 2018-06-09 NOTE — Telephone Encounter (Signed)
Dr Valero EnergyMS has already placed an order for the referral.  Someone should be contacting him with an appt date and time.  Let us know if not contacted.

## 2018-06-16 NOTE — Progress Notes (Deleted)
[facility]  Telephone:(336) [phone] [phone]) [phone]  ID: Donald AlyChristopher J Lane OB: 01-Dec-1995  MR#: [id number]  QIO#:[id number]#:[id number]  Patient Care Team: McLean-Scocuzza, Pasty Spillersracy N, MD as PCP - General (Internal Medicine)   CHIEF COMPLAINT: Polycythemia.  INTERVAL HISTORY: ***  REVIEW OF SYSTEMS:   ROS  As per HPI. Otherwise, a complete review of systems is negative.  PAST MEDICAL HISTORY: Past Medical History:  Diagnosis Date  . Anxiety   . Frequent headaches   . Polycythemia     PAST SURGICAL HISTORY: Past Surgical History:  Procedure Laterality Date  . TONSILLECTOMY AND ADENOIDECTOMY  2008  . WISDOM TOOTH EXTRACTION      FAMILY HISTORY: Family History  Problem Relation Age of Onset  . Alcohol abuse Paternal Aunt   . Diabetes Father   . Diabetes Maternal Grandmother   . Migraines Maternal Grandfather   . Cancer Paternal Grandfather     ADVANCED DIRECTIVES (Y/N):  N  HEALTH MAINTENANCE: Social History   Tobacco Use  . Smoking status: Never Smoker  . Smokeless tobacco: Never Used  Substance Use Topics  . Alcohol use: No    Alcohol/week: 0.0 standard drinks  . Drug use: No     Colonoscopy:  PAP:  Bone density:  Lipid panel:  No Known Allergies  Current Outpatient Medications  Medication Sig Dispense Refill  . aspirin-acetaminophen-caffeine (MIGRAINE FORMULA) 250-250-65 MG tablet Take by mouth every 6 (six) hours as needed for headache. Excedrin migraine    . busPIRone (BUSPAR) 10 MG tablet TAKE 1 TABLET BY MOUTH TWICE A DAY 180 tablet 4  . ibuprofen (ADVIL,MOTRIN) 200 MG tablet Take 200 mg by mouth every 6 (six) hours as needed.    . Lidocaine-Glycerin (PREPARATION H) 5-14.4 % CREA Place rectally.    . nortriptyline (PAMELOR) 25 MG capsule Take 1 capsule (25 mg total) by mouth at bedtime. 30 capsule 3  . nortriptyline (PAMELOR) 25 MG capsule TAKE 1 CAPSULE (25 MG TOTAL) BY MOUTH AT BEDTIME. 90 capsule 2  . phenylephrine-shark  liver oil-mineral oil-petrolatum (PREPARATION H) 0.25-14-74.9 % rectal ointment Place 1 application rectally 2 (two) times daily as needed for hemorrhoids.    Marland Kitchen. scopolamine (TRANSDERM-SCOP) 1 MG/3DAYS Place 1 patch (1.5 mg total) onto the skin every 3 (three) days. Apply >4 hrs before cruise do not cut. Change every 3 days 4 patch 0  . SUMAtriptan (IMITREX) 100 MG tablet Take 1 tablet earliest onset of headache.  May repeat x1 in 2 hours if headache persists or recurs.  Do not exceed 2 tablets in 24 hours 10 tablet 5   No current facility-administered medications for this visit.     OBJECTIVE: There were no vitals filed for this visit.   There is no height or weight on file to calculate BMI.    ECOG FS:{CHL ONC [phone]}  General: Well-developed, well-nourished, no acute distress. Eyes: Pink conjunctiva, anicteric sclera. HEENT: Normocephalic, moist mucous membranes, clear oropharnyx. Lungs: Clear to auscultation bilaterally. Heart: Regular rate and rhythm. No rubs, murmurs, or gallops. Abdomen: Soft, nontender, nondistended. No organomegaly noted, normoactive bowel sounds. Musculoskeletal: No edema, cyanosis, or clubbing. Neuro: Alert, answering all questions appropriately. Cranial nerves grossly intact. Skin: No rashes or petechiae noted. Psych: Normal affect. Lymphatics: No cervical, calvicular, axillary or inguinal LAD.   LAB RESULTS:  Lab Results  Component Value Date   NA 137 10/14/2017   K 3.8 10/14/2017   CL 102 10/14/2017   CO2 28 10/14/2017   GLUCOSE 84 10/14/2017  BUN 9 10/14/2017   CREATININE 0.88 10/14/2017   CALCIUM 9.6 10/14/2017   PROT 7.9 10/14/2017   ALBUMIN 5.1 10/14/2017   AST 23 10/14/2017   ALT 19 10/14/2017   ALKPHOS 77 10/14/2017   BILITOT 0.7 10/14/2017    Lab Results  Component Value Date   WBC 5.1 05/25/2018   NEUTROABS 2.2 05/25/2018   HGB 17.1 (H) 05/25/2018   HCT 49.1 (H) 05/25/2018   MCV 89.1 05/25/2018   PLT 166.0 05/25/2018      STUDIES: No results found.  ASSESSMENT: Polycythemia  PLAN:    1. Polycythemia:  Patient expressed understanding and was in agreement with this plan. He also understands that He can call clinic at any time with any questions, concerns, or complaints.   Cancer Staging No matching staging information was found for the patient.  Jeralyn Ruthsimothy J Finnegan, MD   06/16/2018 11:00 PM

## 2018-06-18 ENCOUNTER — Inpatient Hospital Stay: Payer: PRIVATE HEALTH INSURANCE | Admitting: Oncology

## 2018-07-20 NOTE — Progress Notes (Deleted)
NEUROLOGY FOLLOW UP OFFICE NOTE  Donald Lane [id number]  HISTORY OF PRESENT ILLNESS: Donald Lane is a 22 year old male with migraines and anxiety who follows up for migraines.  UPDATE: Intensity:  *** Duration:  *** Frequency:  *** Frequency of abortive medication: *** Rescue protocol:  First ibuprofen, second sumatriptan 25mg  in 30 minutes Current NSAIDS:  Ibuprofen *** Current analgesics:  no Current triptans:  Sumatriptan 25mg  to 100mg  Current ergotamine:  no Current anti-emetic:  no Current muscle relaxants:  no Current anti-anxiolytic:  buspirone Current sleep aide:  no Current Antihypertensive medications:  no Current Antidepressant medications:  Nortriptyline 25mg  Current Anticonvulsant medications:  no Current anti-CGRP:  no Current Vitamins/Herbal/Supplements:  no Current Antihistamines/Decongestants:  no Other therapy:  no  Caffeine:  Coffee once a week, tea Alcohol:  no Smoker:  no Diet: hydrates, little soda Exercise:  no Depression:  no; Anxiety:  yes Other pain:  no Sleep hygiene:  Sleeps well but does not follow routine sleep schedule.  HISTORY: Onset:  He has had "headaches" since childhood, but started having "migraines" since 2018 Location:  Migraines:  Holocephalic/above right eye; Headaches:  Bi-temporal Quality:  Migraines:  Pounding; Headaches:  pressure Initial Intensity:  Migraines:  Severe; Headaches: dull-moderate Aura:  no Prodrome:  no Postdrome:  no Associated symptoms: Migraines:  Photophobia, phonophobia  Sometimes nausea, rarely vomiting.  No osmophobia, autonomic symptoms or visual disturbance.  He denies new worse headache of his life or causing him to wake up from sleep Initial Duration:  3 to 4 hours Initial Frequency:  Migraines:  1 to 2 days a month; Headaches: 3 days a week Initial Frequency of abortive medication: ibuprofen once a week Triggers/aggravating factors:  Emotional stress Relieving  factors:  rest Activity:  Migraines aggravated.  Headaches are not aggravated.  Past NSAIDS:  ASA Past analgesics:  Excedrin Past abortive triptans:  no Past muscle relaxants:  no Past anti-emetic:  no Past antihypertensive medications:  no Past antidepressant medications:  no Past anticonvulsant medications:  no Past vitamins/Herbal/Supplements:  Essential oils Past antihistamines/decongestants:  no Other past therapies:  no  Family history of headache:  mother, father, maternal cousins  MRI of brain without contrast from 11/01/17 was personally reviewed and is unremarkable.  PAST MEDICAL HISTORY: Past Medical History:  Diagnosis Date  . Anxiety   . Frequent headaches   . Polycythemia     MEDICATIONS: Current Outpatient Medications on File Prior to Visit  Medication Sig Dispense Refill  . aspirin-acetaminophen-caffeine (MIGRAINE FORMULA) 250-250-65 MG tablet Take by mouth every 6 (six) hours as needed for headache. Excedrin migraine    . busPIRone (BUSPAR) 10 MG tablet TAKE 1 TABLET BY MOUTH TWICE A DAY 180 tablet 4  . ibuprofen (ADVIL,MOTRIN) 200 MG tablet Take 200 mg by mouth every 6 (six) hours as needed.    . Lidocaine-Glycerin (PREPARATION H) 5-14.4 % CREA Place rectally.    . nortriptyline (PAMELOR) 25 MG capsule Take 1 capsule (25 mg total) by mouth at bedtime. 30 capsule 3  . nortriptyline (PAMELOR) 25 MG capsule TAKE 1 CAPSULE (25 MG TOTAL) BY MOUTH AT BEDTIME. 90 capsule 2  . phenylephrine-shark liver oil-mineral oil-petrolatum (PREPARATION H) 0.25-14-74.9 % rectal ointment Place 1 application rectally 2 (two) times daily as needed for hemorrhoids.    Marland Kitchen scopolamine (TRANSDERM-SCOP) 1 MG/3DAYS Place 1 patch (1.5 mg total) onto the skin every 3 (three) days. Apply >4 hrs before cruise do not cut. Change every 3 days 4 patch 0  .  SUMAtriptan (IMITREX) 100 MG tablet Take 1 tablet earliest onset of headache.  May repeat x1 in 2 hours if headache persists or recurs.  Do  not exceed 2 tablets in 24 hours 10 tablet 5   No current facility-administered medications on file prior to visit.     ALLERGIES: No Known Allergies  FAMILY HISTORY: Family History  Problem Relation Age of Onset  . Alcohol abuse Paternal Aunt   . Diabetes Father   . Diabetes Maternal Grandmother   . Migraines Maternal Grandfather   . Cancer Paternal Grandfather    SOCIAL HISTORY: Social History   Socioeconomic History  . Marital status: Single    Spouse name: Not on file  . Number of children: Not on file  . Years of education: Not on file  . Highest education level: 12th grade  Occupational History  . 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 Financial resource strain: Not on file  . Food insecurity:    Worry: Not on file    Inability: Not on file  . Transportation needs:    Medical: Not on file    Non-medical: Not on file  Tobacco Use  . Smoking status: Never Smoker  . Smokeless tobacco: Never Used  Substance and Sexual Activity  . Alcohol use: No    Alcohol/week: 0.0 standard drinks  . Drug use: No  . Sexual activity: Not on file  Lifestyle  . Physical activity:    Days per week: Not on file    Minutes per session: Not on file  . Stress: Not on file  Relationships  . Social connections:    Talks on phone: Not on file    Gets together: Not on file    Attends religious service: Not on file    Active member of club or organization: Not on file    Attends meetings of clubs or organizations: Not on file    Relationship status: Not on file  . Intimate partner violence:    Fear of current or ex partner: Not on file    Emotionally abused: Not on file    Physically abused: Not on file    Forced sexual activity: Not on file  Other Topics Concern  . Not on file  Social History Narrative   Single, lives with parents in a 2 story home. Has 3 dogs and 1 cat. Has an occasional coffee or soda. Drinks approximately 12oz of hot tea a day.     REVIEW OF SYSTEMS: Constitutional: No fevers, chills, or sweats, no generalized fatigue, change in appetite Eyes: No visual changes, double vision, eye pain Ear, nose and throat: No hearing loss, ear pain, nasal congestion, sore throat Cardiovascular: No chest pain, palpitations Respiratory:  No shortness of breath at rest or with exertion, wheezes GastrointestinaI: No nausea, vomiting, diarrhea, abdominal pain, fecal incontinence Genitourinary:  No dysuria, urinary retention or frequency Musculoskeletal:  No neck pain, back pain Integumentary: No rash, pruritus, skin lesions Neurological: as above Psychiatric: No depression, insomnia, anxiety Endocrine: No palpitations, fatigue, diaphoresis, mood swings, change in appetite, change in weight, increased thirst Hematologic/Lymphatic:  No purpura, petechiae. Allergic/Immunologic: no itchy/runny eyes, nasal congestion, recent allergic reactions, rashes  PHYSICAL EXAM: *** General: No acute distress.  Patient appears ***-groomed.  *** body habitus. Head:  Normocephalic/atraumatic Eyes:  Fundi examined but not visualized Neck: supple, no paraspinal tenderness, full range of motion Heart:  Regular rate and rhythm Lungs:  Clear to auscultation bilaterally  Back: No paraspinal tenderness Neurological Exam: alert and oriented to person, place, and time. Attention span and concentration intact, recent and remote memory intact, fund of knowledge intact.  Speech fluent and not dysarthric, language intact.  CN II-XII intact. Bulk and tone normal, muscle strength 5/5 throughout.  Sensation to light touch, temperature and vibration intact.  Deep tendon reflexes 2+ throughout, toes downgoing.  Finger to nose and heel to shin testing intact.  Gait normal, Romberg negative.  IMPRESSION: Migraine without aura, without status migrainosus, not intractable  PLAN: 1. Nortriptyline 25mg  at bedtime 2.  Ibuprofen and sumatriptan as needed for abortive  therapy 3.  Limit use of pain relievers to no more than 2 days out of week to prevent risk of rebound or medication-overuse headache. 4.  Keep headache diary 5.  Follow up in 6 months  Shon Millet, DO  CC: Quentin Ore, MD

## 2018-07-21 ENCOUNTER — Ambulatory Visit: Payer: No Typology Code available for payment source | Admitting: Neurology

## 2018-07-29 ENCOUNTER — Ambulatory Visit: Payer: PRIVATE HEALTH INSURANCE

## 2018-08-20 NOTE — Progress Notes (Signed)
NEUROLOGY FOLLOW UP OFFICE NOTE  Donald Lane [id number]  HISTORY OF PRESENT ILLNESS: Donald Lane is a 22 year old male with migraines and anxiety who follows up for migraines.  UPDATE: Intensity:  Migraines severe; headaches dull Duration:  Headaches in 30-60 minutes, migraines in 3 hours Frequency:  5-6 headache days a month (1-2 migraines) Headaches often triggered by dehydration Frequency of abortive medication: 5-6 days a month Rescue protocol:  First ibuprofen, second sumatriptan 50mg  in 30 minutes Current NSAIDS:  Ibuprofen  Current analgesics:  no Current triptans:  Sumatriptan 50mg  Current ergotamine:  no Current anti-emetic:  no Current muscle relaxants:  no Current anti-anxiolytic:  buspirone Current sleep aide:  no Current Antihypertensive medications:  no Current Antidepressant medications:  Nortriptyline 25mg  Current Anticonvulsant medications:  no Current anti-CGRP:  no Current Vitamins/Herbal/Supplements:  no Current Antihistamines/Decongestants:  no Other therapy:  no  Caffeine:  Coffee once a week, tea Alcohol:  no Smoker:  no Diet: needs to improve water intake, little soda Exercise:  no Depression:  no; Anxiety:  yes Other pain:  no Sleep hygiene:  Sleeps well but does not follow routine sleep schedule.  HISTORY: Onset:  He has had "headaches" since childhood, but started having "migraines" since 2018 Location:  Migraines:  Holocephalic/above right eye; Headaches:  Bi-temporal Quality:  Migraines:  Pounding; Headaches:  pressure Initial Intensity:  Migraines:  Severe; Headaches: dull-moderate Aura:  no Prodrome:  no Postdrome:  no Associated symptoms: Migraines:  Photophobia, phonophobia  Sometimes nausea, rarely vomiting.  No osmophobia, autonomic symptoms or visual disturbance.  He denies new worse headache of his life or causing him to wake up from sleep Initial Duration:  3 to 4 hours Initial Frequency:  Migraines:  1  to 2 days a month; Headaches: 3 days a week Initial Frequency of abortive medication: ibuprofen once a week Triggers/aggravating factors:  Emotional stress Relieving factors:  rest Activity:  Migraines aggravated.  Headaches are not aggravated.  Past NSAIDS:  ASA Past analgesics:  Excedrin Past abortive triptans:  no Past muscle relaxants:  no Past anti-emetic:  no Past antihypertensive medications:  no Past antidepressant medications:  no Past anticonvulsant medications:  no Past vitamins/Herbal/Supplements:  Essential oils Past antihistamines/decongestants:  no Other past therapies:  no  Family history of headache:  mother, father, maternal cousins  MRI of brain without contrast from 11/01/17 was personally reviewed and is unremarkable.  PAST MEDICAL HISTORY: Past Medical History:  Diagnosis Date  . Anxiety   . Frequent headaches   . Polycythemia     MEDICATIONS: Current Outpatient Medications on File Prior to Visit  Medication Sig Dispense Refill  . aspirin-acetaminophen-caffeine (MIGRAINE FORMULA) 250-250-65 MG tablet Take by mouth every 6 (six) hours as needed for headache. Excedrin migraine    . busPIRone (BUSPAR) 10 MG tablet TAKE 1 TABLET BY MOUTH TWICE A DAY 180 tablet 4  . ibuprofen (ADVIL,MOTRIN) 200 MG tablet Take 200 mg by mouth every 6 (six) hours as needed.    . Lidocaine-Glycerin (PREPARATION H) 5-14.4 % CREA Place rectally.    . nortriptyline (PAMELOR) 25 MG capsule Take 1 capsule (25 mg total) by mouth at bedtime. 30 capsule 3  . nortriptyline (PAMELOR) 25 MG capsule TAKE 1 CAPSULE (25 MG TOTAL) BY MOUTH AT BEDTIME. 90 capsule 2  . phenylephrine-shark liver oil-mineral oil-petrolatum (PREPARATION H) 0.25-14-74.9 % rectal ointment Place 1 application rectally 2 (two) times daily as needed for hemorrhoids.    Marland Kitchen scopolamine (TRANSDERM-SCOP) 1 MG/3DAYS Place 1  patch (1.5 mg total) onto the skin every 3 (three) days. Apply >4 hrs before cruise do not cut. Change  every 3 days 4 patch 0  . SUMAtriptan (IMITREX) 100 MG tablet Take 1 tablet earliest onset of headache.  May repeat x1 in 2 hours if headache persists or recurs.  Do not exceed 2 tablets in 24 hours 10 tablet 5   No current facility-administered medications on file prior to visit.     ALLERGIES: No Known Allergies  FAMILY HISTORY: Family History  Problem Relation Age of Onset  . Alcohol abuse Paternal Aunt   . Diabetes Father   . Diabetes Maternal Grandmother   . Migraines Maternal Grandfather   . Cancer Paternal Grandfather    SOCIAL HISTORY: Social History   Socioeconomic History  . Marital status: Single    Spouse name: Not on file  . Number of children: Not on file  . Years of education: Not on file  . Highest education level: 12th grade  Occupational History  . 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 Financial resource strain: Not on file  . Food insecurity:    Worry: Not on file    Inability: Not on file  . Transportation needs:    Medical: Not on file    Non-medical: Not on file  Tobacco Use  . Smoking status: Never Smoker  . Smokeless tobacco: Never Used  Substance and Sexual Activity  . Alcohol use: No    Alcohol/week: 0.0 standard drinks  . Drug use: No  . Sexual activity: Not on file  Lifestyle  . Physical activity:    Days per week: Not on file    Minutes per session: Not on file  . Stress: Not on file  Relationships  . Social connections:    Talks on phone: Not on file    Gets together: Not on file    Attends religious service: Not on file    Active member of club or organization: Not on file    Attends meetings of clubs or organizations: Not on file    Relationship status: Not on file  . Intimate partner violence:    Fear of current or ex partner: Not on file    Emotionally abused: Not on file    Physically abused: Not on file    Forced sexual activity: Not on file  Other Topics Concern  . Not on file  Social History  Narrative   Single, lives with parents in a 2 story home. Has 3 dogs and 1 cat. Has an occasional coffee or soda. Drinks approximately 12oz of hot tea a day.    REVIEW OF SYSTEMS: Constitutional: No fevers, chills, or sweats, no generalized fatigue, change in appetite Eyes: No visual changes, double vision, eye pain Ear, nose and throat: No hearing loss, ear pain, nasal congestion, sore throat Cardiovascular: No chest pain, palpitations Respiratory:  No shortness of breath at rest or with exertion, wheezes GastrointestinaI: No nausea, vomiting, diarrhea, abdominal pain, fecal incontinence Genitourinary:  No dysuria, urinary retention or frequency Musculoskeletal:  No neck pain, back pain Integumentary: No rash, pruritus, skin lesions Neurological: as above Psychiatric: No depression, insomnia, anxiety Endocrine: No palpitations, fatigue, diaphoresis, mood swings, change in appetite, change in weight, increased thirst Hematologic/Lymphatic:  No purpura, petechiae. Allergic/Immunologic: no itchy/runny eyes, nasal congestion, recent allergic reactions, rashes  PHYSICAL EXAM: Blood pressure 124/88, pulse 94, height 6' (1.829 m), weight 179 lb (81.2 kg), SpO2 97 %.  General: No acute distress.  Patient appears well-groomed.  normal body habitus. Head:  Normocephalic/atraumatic Eyes:  Fundi examined but not visualized Neck: supple, no paraspinal tenderness, full range of motion Heart:  Regular rate and rhythm Lungs:  Clear to auscultation bilaterally Back: No paraspinal tenderness Neurological Exam: alert and oriented to person, place, and time. Attention span and concentration intact, recent and remote memory intact, fund of knowledge intact.  Speech fluent and not dysarthric, language intact.  CN II-XII intact. Bulk and tone normal, muscle strength 5/5 throughout.  Sensation to light touch, temperature and vibration intact.  Deep tendon reflexes 2+ throughout, toes downgoing.  Finger to nose  and heel to shin testing intact.  Gait normal, Romberg negative.  IMPRESSION: Migraine without aura, without status migrainosus, not intractable  PLAN: 1. Nortriptyline 25mg  at bedtime 2.  Ibuprofen and sumatriptan as needed for abortive therapy 3.  Limit use of pain relievers to no more than 2 days out of week to prevent risk of rebound or medication-overuse headache. 4.  Keep headache diary 5.  Follow up in 6 months  Shon Millet, DO  CC: Quentin Ore, MD

## 2018-08-21 ENCOUNTER — Encounter: Payer: Self-pay | Admitting: Neurology

## 2018-08-21 ENCOUNTER — Ambulatory Visit (INDEPENDENT_AMBULATORY_CARE_PROVIDER_SITE_OTHER): Payer: PRIVATE HEALTH INSURANCE | Admitting: Neurology

## 2018-08-21 VITALS — BP 124/88 | HR 94 | Ht 72.0 in | Wt 179.0 lb

## 2018-08-21 DIAGNOSIS — G44219 Episodic tension-type headache, not intractable: Secondary | ICD-10-CM | POA: Diagnosis not present

## 2018-08-21 DIAGNOSIS — G43009 Migraine without aura, not intractable, without status migrainosus: Secondary | ICD-10-CM

## 2018-08-21 NOTE — Patient Instructions (Signed)
1.  For preventative management, continue nortriptyline 25mg  at bedtime 2.  For abortive therapy, take ibuprofen.  If it becomes a migraine in 30 minutes, then take sumatriptan 100mg .  May repeat dose once after 2 hours if needed (no more than 2 tablets in 24 hours) 3.  Limit use of pain relievers to no more than 2 days out of week to prevent risk of rebound or medication-overuse headache. 4.  Keep headache diary 5.  Exercise, hydration, caffeine cessation, sleep hygiene, monitor for and avoid triggers 6.  Consider:  magnesium citrate 400mg  daily, riboflavin 400mg  daily, and coenzyme Q10 100mg  three times daily 7.  Follow up in 6 months

## 2018-10-07 ENCOUNTER — Telehealth: Payer: PRIVATE HEALTH INSURANCE | Admitting: Family

## 2018-10-07 DIAGNOSIS — H66009 Acute suppurative otitis media without spontaneous rupture of ear drum, unspecified ear: Secondary | ICD-10-CM

## 2018-10-07 MED ORDER — AMOXICILLIN 500 MG PO CAPS: 1000.0000 mg | ORAL_CAPSULE | Freq: Three times a day (TID) | ORAL | 0 refills | Status: DC

## 2018-10-07 NOTE — Progress Notes (Signed)
E Visit for Swimmer's Ear  We are sorry that you are not feeling well. Here is how we plan to help!   Based on what you have told me you may have a bacterial infection of your ear called an acute otitis media.   I have sent a prescription of amoxicillin 1000 mg three times a day for 5 days.    In certain cases swimmer's ear may progress to a more serious bacterial infection of the middle or inner ear.  If you have a fever 102 and up and significantly worsening symptoms, this could indicate a more serious infection moving to the middle/inner and needs face to face evaluation in an office by a provider.  Your symptoms should improve over the next 3 days and should resolve in about 7 days.  HOME CARE:   Wash your hands frequently.  Do not place the tip of the bottle on your ear or touch it with your fingers.  You can take Acetominophen 650 mg every 4-6 hours as needed for pain.  If pain is severe or moderate, you can apply a heating pad (set on low) or hot water bottle (wrapped in a towel) to outer ear for 20 minutes.  This will also increase drainage.  Avoid ear plugs  Do not use Q-tips  After showers, help the water run out by tilting your head to one side.  GET HELP RIGHT AWAY IF:   Fever is over 102.2 degrees.  You develop progressive ear pain or hearing loss.  Ear symptoms persist longer than 3 days after treatment.  MAKE SURE YOU:   Understand these instructions.  Will watch your condition.  Will get help right away if you are not doing well or get worse.  TO PREVENT SWIMMER'S EAR:  Use a bathing cap or custom fitted swim molds to keep your ears dry.  Towel off after swimming to dry your ears.  Tilt your head or pull your earlobes to allow the water to escape your ear canal.  If there is still water in your ears, consider using a hairdryer on the lowest setting.  Thank you for choosing an e-visit. Your e-visit answers were reviewed by a board certified  advanced clinical practitioner to complete your personal care plan. Depending upon the condition, your plan could have included both over the counter or prescription medications. Please review your pharmacy choice. Be sure that the pharmacy you have chosen is open so that you can pick up your prescription now.  If there is a problem you may message your provider in MyChart to have the prescription routed to another pharmacy. Your safety is important to us. If you have drug allergies check your prescription carefully.  For the next 24 hours, you can use MyChart to ask questions about today's visit, request a non-urgent call back, or ask for a work or school excuse from your e-visit provider. You will get an email in the next two days asking about your experience. I hope that your e-visit has been valuable and will speed your recovery.

## 2018-11-25 ENCOUNTER — Ambulatory Visit: Payer: PRIVATE HEALTH INSURANCE | Admitting: Internal Medicine

## 2018-11-25 ENCOUNTER — Encounter: Payer: Self-pay | Admitting: Internal Medicine

## 2018-11-25 VITALS — BP 110/80 | HR 100 | Temp 98.3°F | Resp 18 | Ht 73.0 in | Wt 181.4 lb

## 2018-11-25 DIAGNOSIS — Z0000 Encounter for general adult medical examination without abnormal findings: Secondary | ICD-10-CM | POA: Diagnosis not present

## 2018-11-25 DIAGNOSIS — H6690 Otitis media, unspecified, unspecified ear: Secondary | ICD-10-CM

## 2018-11-25 DIAGNOSIS — Z1329 Encounter for screening for other suspected endocrine disorder: Secondary | ICD-10-CM

## 2018-11-25 DIAGNOSIS — Z23 Encounter for immunization: Secondary | ICD-10-CM

## 2018-11-25 DIAGNOSIS — E559 Vitamin D deficiency, unspecified: Secondary | ICD-10-CM | POA: Diagnosis not present

## 2018-11-25 DIAGNOSIS — Z1389 Encounter for screening for other disorder: Secondary | ICD-10-CM | POA: Diagnosis not present

## 2018-11-25 DIAGNOSIS — Z0184 Encounter for antibody response examination: Secondary | ICD-10-CM

## 2018-11-25 DIAGNOSIS — D751 Secondary polycythemia: Secondary | ICD-10-CM | POA: Diagnosis not present

## 2018-11-25 DIAGNOSIS — Z1159 Encounter for screening for other viral diseases: Secondary | ICD-10-CM

## 2018-11-25 DIAGNOSIS — G43009 Migraine without aura, not intractable, without status migrainosus: Secondary | ICD-10-CM

## 2018-11-25 DIAGNOSIS — F419 Anxiety disorder, unspecified: Secondary | ICD-10-CM

## 2018-11-25 LAB — COMPREHENSIVE METABOLIC PANEL
ALT: 24 U/L (ref 0–53)
AST: 22 U/L (ref 0–37)
Albumin: 4.9 g/dL (ref 3.5–5.2)
Alkaline Phosphatase: 81 U/L (ref 39–117)
BILIRUBIN TOTAL: 0.3 mg/dL (ref 0.2–1.2)
BUN: 10 mg/dL (ref 6–23)
CO2: 30 mEq/L (ref 19–32)
Calcium: 10.2 mg/dL (ref 8.4–10.5)
Chloride: 102 mEq/L (ref 96–112)
Creatinine, Ser: 0.95 mg/dL (ref 0.40–1.50)
GFR: 100.78 mL/min (ref >60.00)
Glucose, Bld: 92 mg/dL (ref 70–99)
Potassium: 4.3 mEq/L (ref 3.5–5.1)
Sodium: 139 mEq/L (ref 135–145)
Total Protein: 7.6 g/dL (ref 6.0–8.3)

## 2018-11-25 LAB — CBC WITH DIFFERENTIAL/PLATELET
BASOS ABS: 0 10*3/uL (ref 0.0–0.1)
Basophils Relative: 0.5 % (ref 0.0–3.0)
Eosinophils Absolute: 0.1 10*3/uL (ref 0.0–0.7)
Eosinophils Relative: 1.1 % (ref 0.0–5.0)
HCT: 48.5 % (ref 39.0–52.0)
Hemoglobin: 16.9 g/dL (ref 13.0–17.0)
Lymphocytes Relative: 47.9 % — ABNORMAL HIGH (ref 12.0–46.0)
Lymphs Abs: 2.7 10*3/uL (ref 0.7–4.0)
MCHC: 34.9 g/dL (ref 30.0–36.0)
MCV: 88.7 fl (ref 78.0–100.0)
Monocytes Absolute: 0.6 10*3/uL (ref 0.1–1.0)
Monocytes Relative: 10.2 % (ref 3.0–12.0)
Neutro Abs: 2.3 10*3/uL (ref 1.4–7.7)
Neutrophils Relative %: 40.3 % — ABNORMAL LOW (ref 43.0–77.0)
Platelets: 176 10*3/uL (ref 150.0–400.0)
RBC: 5.47 Mil/uL (ref 4.22–5.81)
RDW: 12.6 % (ref 11.5–14.6)
WBC: 5.6 10*3/uL (ref 4.5–10.5)

## 2018-11-25 LAB — VITAMIN D 25 HYDROXY (VIT D DEFICIENCY, FRACTURES): VITD: 24.95 ng/mL — ABNORMAL LOW (ref 30.00–100.00)

## 2018-11-25 LAB — T4, FREE: Free T4: 0.8 ng/dL (ref 0.60–1.60)

## 2018-11-25 LAB — TSH: TSH: 1.95 u[IU]/mL (ref 0.35–5.50)

## 2018-11-25 MED ORDER — NEOMYCIN-POLYMYXIN-HC 3.5-10000-1 OT SOLN: 4.0000 [drp] | Freq: Three times a day (TID) | OTIC | 0 refills | Status: DC

## 2018-11-25 NOTE — Progress Notes (Signed)
Chief Complaint  Patient presents with  . Follow-up    anxiety   F/u  1. Polycythemia Jak 2 negative denies mtns, CO exposure he does snore at night sometimes  2. Ear infection 10/07/28 amoxil and prednisone better  3. Migraines controlled on meds 3. Anxiety controlled on meds   Review of Systems  Constitutional: Negative for weight loss.  HENT: Negative for hearing loss.   Eyes: Negative for blurred vision.  Respiratory: Negative for shortness of breath.   Cardiovascular: Negative for chest pain.  Gastrointestinal: Negative for abdominal pain.  Skin: Negative for rash.  Neurological: Negative for headaches.  Psychiatric/Behavioral: Negative for depression. The patient is nervous/anxious.    Past Medical History:  Diagnosis Date  . Anxiety   . Frequent headaches   . Polycythemia    Past Surgical History:  Procedure Laterality Date  . TONSILLECTOMY AND ADENOIDECTOMY  2008  . WISDOM TOOTH EXTRACTION     Family History  Problem Relation Age of Onset  . Alcohol abuse Paternal Aunt   . Diabetes Father   . Diabetes Maternal Grandmother   . Migraines Maternal Grandfather   . Cancer Paternal Grandfather    Social History   Socioeconomic History  . Marital status: Single    Spouse name: Not on file  . Number of children: Not on file  . Years of education: Not on file  . Highest education level: 12th grade  Occupational History  . 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 Financial resource strain: Not on file  . Food insecurity:    Worry: Not on file    Inability: Not on file  . Transportation needs:    Medical: Not on file    Non-medical: Not on file  Tobacco Use  . Smoking status: Never Smoker  . Smokeless tobacco: Never Used  Substance and Sexual Activity  . Alcohol use: No    Alcohol/week: 0.0 standard drinks  . Drug use: No  . Sexual activity: Not on file  Lifestyle  . Physical activity:    Days per week: Not on file    Minutes per  session: Not on file  . Stress: Not on file  Relationships  . Social connections:    Talks on phone: Not on file    Gets together: Not on file    Attends religious service: Not on file    Active member of club or organization: Not on file    Attends meetings of clubs or organizations: Not on file    Relationship status: Not on file  . Intimate partner violence:    Fear of current or ex partner: Not on file    Emotionally abused: Not on file    Physically abused: Not on file    Forced sexual activity: Not on file  Other Topics Concern  . Not on file  Social History Narrative   Single, lives with parents in a 2 story home. Has 3 dogs and 1 cat. Has an occasional coffee or soda. Drinks approximately 12oz of hot tea a day.   Current Meds  Medication Sig  . aspirin-acetaminophen-caffeine (MIGRAINE FORMULA) 250-250-65 MG tablet Take by mouth every 6 (six) hours as needed for headache. Excedrin migraine  . busPIRone (BUSPAR) 10 MG tablet TAKE 1 TABLET BY MOUTH TWICE A DAY  . ibuprofen (ADVIL,MOTRIN) 200 MG tablet Take 200 mg by mouth every 6 (six) hours as needed.  . Lidocaine-Glycerin (PREPARATION H) 5-14.4 % CREA Place  rectally.  . nortriptyline (PAMELOR) 25 MG capsule Take 1 capsule (25 mg total) by mouth at bedtime.  . phenylephrine-shark liver oil-mineral oil-petrolatum (PREPARATION H) 0.25-14-74.9 % rectal ointment Place 1 application rectally 2 (two) times daily as needed for hemorrhoids.  Marland Kitchen scopolamine (TRANSDERM-SCOP) 1 MG/3DAYS Place 1 patch (1.5 mg total) onto the skin every 3 (three) days. Apply >4 hrs before cruise do not cut. Change every 3 days  . SUMAtriptan (IMITREX) 100 MG tablet Take 1 tablet earliest onset of headache.  May repeat x1 in 2 hours if headache persists or recurs.  Do not exceed 2 tablets in 24 hours  . [DISCONTINUED] amoxicillin (AMOXIL) 500 MG capsule Take 2 capsules (1,000 mg total) by mouth 3 (three) times daily.  . [DISCONTINUED] nortriptyline (PAMELOR) 25  MG capsule TAKE 1 CAPSULE (25 MG TOTAL) BY MOUTH AT BEDTIME.   No Known Allergies No results found for this or any previous visit (from the past 2160 hour(s)). Objective  Body mass index is 23.93 kg/m. Wt Readings from Last 3 Encounters:  11/25/18 181 lb 6 oz (82.3 kg)  08/21/18 179 lb (81.2 kg)  05/25/18 181 lb 3.2 oz (82.2 kg) (82 %, Z= 0.90)*   * Growth percentiles are based on CDC (Boys, 2-20 Years) data.   Temp Readings from Last 3 Encounters:  11/25/18 98.3 F (36.8 C) (Oral)  05/25/18 98.1 F (36.7 C)  11/25/17 99.4 F (37.4 C) (Oral)   BP Readings from Last 3 Encounters:  11/25/18 110/80  08/21/18 124/88  05/25/18 (!) 126/92   Pulse Readings from Last 3 Encounters:  11/25/18 94  08/21/18 94  05/25/18 93    Physical Exam Vitals signs and nursing note reviewed.  Constitutional:      Appearance: Normal appearance. He is well-developed and well-groomed.  HENT:     Head: Normocephalic and atraumatic.     Nose: Nose normal.     Mouth/Throat:     Mouth: Mucous membranes are moist.     Pharynx: Oropharynx is clear.  Eyes:     Conjunctiva/sclera: Conjunctivae normal.     Pupils: Pupils are equal, round, and reactive to light.  Cardiovascular:     Rate and Rhythm: Normal rate and regular rhythm.     Heart sounds: Normal heart sounds.  Pulmonary:     Effort: Pulmonary effort is normal.     Breath sounds: Normal breath sounds.  Skin:    General: Skin is warm and dry.  Neurological:     General: No focal deficit present.     Mental Status: He is alert and oriented to person, place, and time. Mental status is at baseline.     Gait: Gait normal.  Psychiatric:        Attention and Perception: Attention and perception normal.        Mood and Affect: Mood and affect normal.        Speech: Speech normal.        Behavior: Behavior normal. Behavior is cooperative.        Thought Content: Thought content normal.        Cognition and Memory: Cognition and memory  normal.        Judgment: Judgment normal.     Assessment   1. Polycythemia neg Jak 2  2. Resolved ear infection  3. Migraines improved  4. Anxiety variable overall improved  5. HM Plan   1. Check labs if still elevated refer hematology  Consider CXR  2. Monitor ears  red on exam will send in polymyxin drops if not better consider ciprodex and oral Abx  3. Cont meds 4. Cont meds  5.  Flu shot and Tdap today  Check hep B today  MMR  HPV vaccines (had) STD neg  Dermatology [place] [facility] referred today on [facility]  Check labs today     Provider: Dr. Olivia Mackie McLean-Scocuzza-[facility]

## 2018-11-25 NOTE — Patient Instructions (Signed)

## 2018-11-26 LAB — URINALYSIS, ROUTINE W REFLEX MICROSCOPIC
Bacteria, UA: NONE SEEN /HPF
Bilirubin Urine: NEGATIVE
Glucose, UA: NEGATIVE
Hgb urine dipstick: NEGATIVE
Leukocytes, UA: NEGATIVE
Nitrite: NEGATIVE
Specific Gravity, Urine: 1.029 (ref 1.001–1.03)
Squamous Epithelial / HPF: NONE SEEN /HPF (ref <=5)
pH: 5.5 (ref 5.0–8.0)

## 2018-11-26 LAB — HEPATITIS B SURFACE ANTIBODY, QUANTITATIVE: Hep B S AB Quant (Post): 759 m[IU]/mL (ref >=10)

## 2019-02-15 ENCOUNTER — Other Ambulatory Visit: Payer: Self-pay | Admitting: Neurology

## 2019-02-18 IMAGING — MR MR HEAD W/O CM
10 series · 48 of 48 positions shown · non-contrast
Comparison: None.

CLINICAL DATA: Migraines worsening over 1 year. Migraines consist
of blurred vision, nausea, and light sensitivity.

EXAM:
MRI HEAD WITHOUT CONTRAST
TECHNIQUE: Multiplanar, multiecho pulse sequences of the brain and surrounding
structures were obtained without intravenous contrast.

[Series 2: T1 · sagittal · 5.0mm · 0.47mm/px · 3 of 23 slices shown (1 of 2)]
[im 1/23]
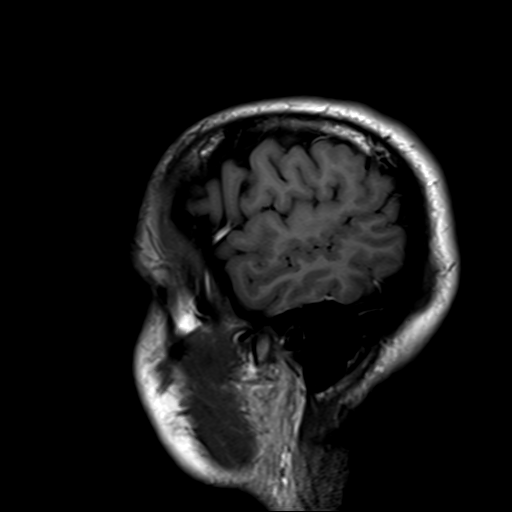
[im 12/23]
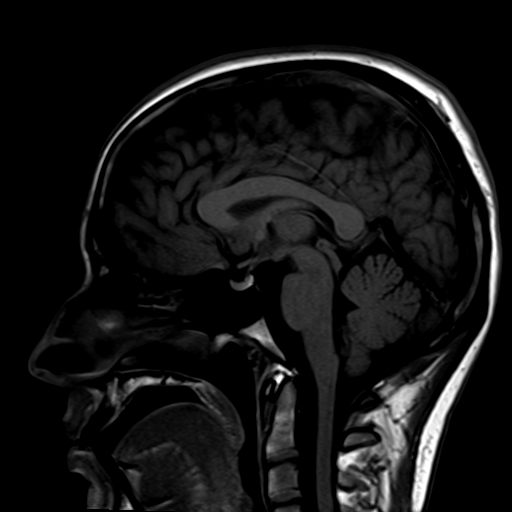
[im 23/23]
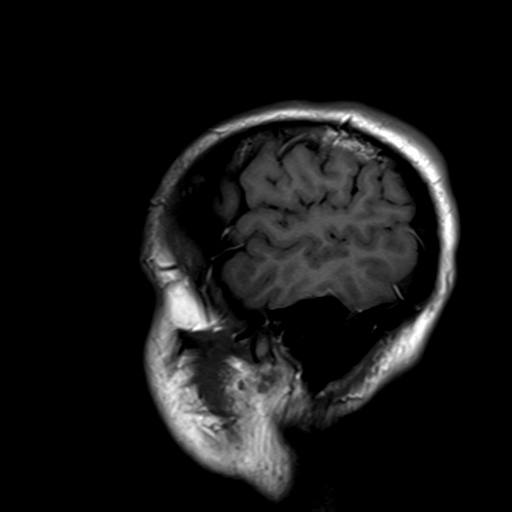

[Series 4: DWI · axial · 3.0mm · 1.80mm/px · z∈[-51,+117]mm · 4 of 57 slices shown (1 of 2)]
[im 1/57]
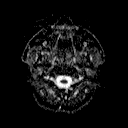
[im 19/57]
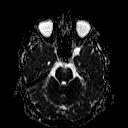
[im 38/57]
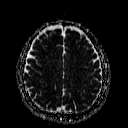
[im 57/57]
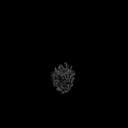

[Series 6: DWI · coronal · 3.0mm · 1.80mm/px · 3 of 47 slices shown (2 of 2)]
[im 1/47]
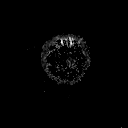
[im 24/47]
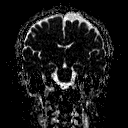
[im 47/47]
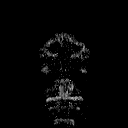

[Series 7: T2 · axial · 5.0mm · 0.60mm/px · z∈[-51,+118]mm · 2 of 27 slices shown (1 of 3)]
[im 1/27]
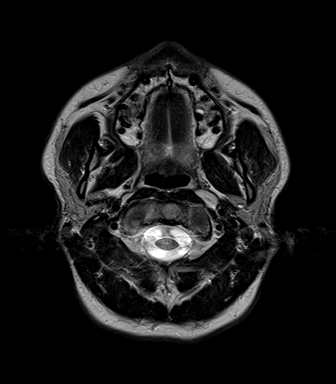
[im 27/27]
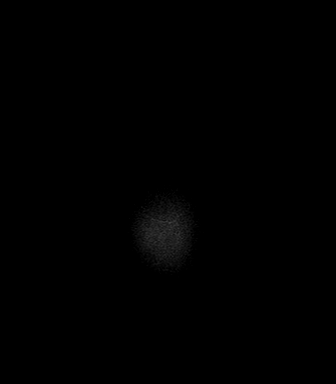

[Series 8: FLAIR · axial · 3.0mm · 0.45mm/px · z∈[-45,+111]mm · 4 of 53 slices shown]
[im 1/53]
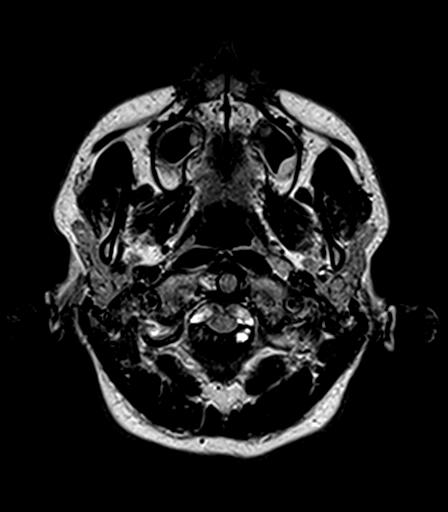
[im 18/53]
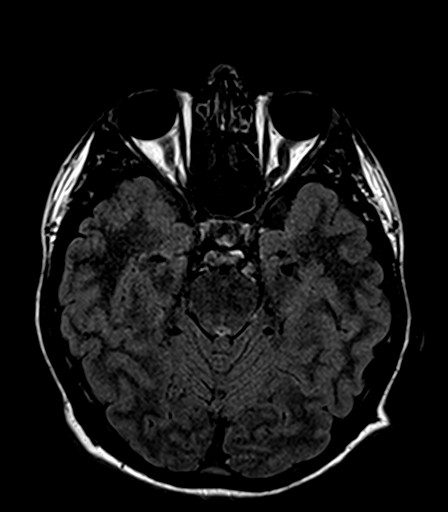
[im 35/53]
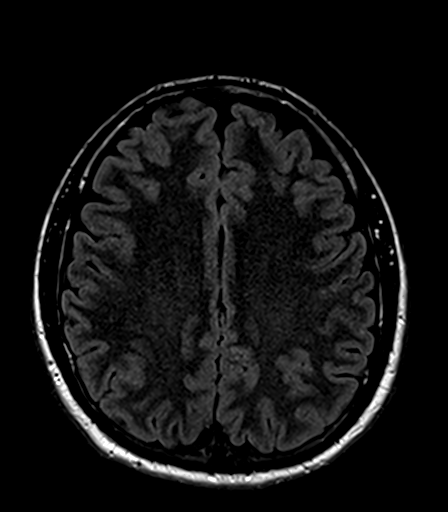
[im 53/53]
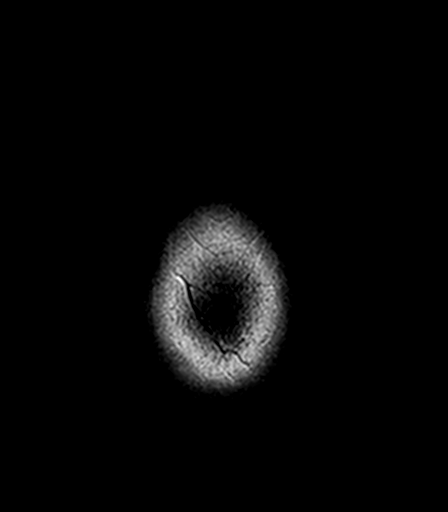

[Series 9: T2 · axial · 5.0mm · 0.45mm/px · z∈[-51,+118]mm · 2 of 27 slices shown (2 of 3)]
[im 1/27]
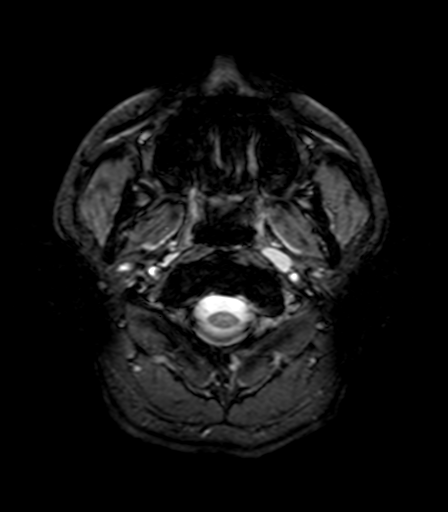
[im 27/27]
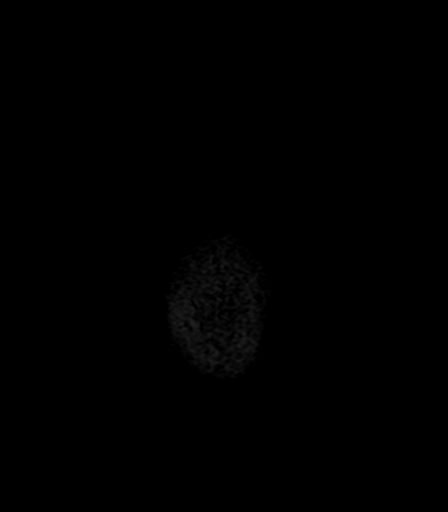

[Series 10: T1 · axial · 1.0mm · 1.00mm/px · z∈[-54,+121]mm · 13 of 176 slices shown (2 of 2)]
[im 1/176]
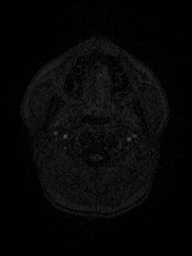
[im 15/176]
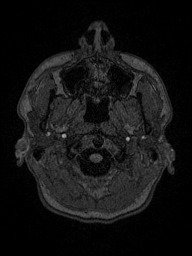
[im 30/176]
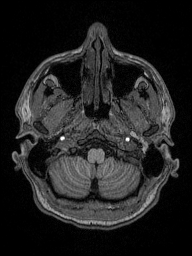
[im 44/176]
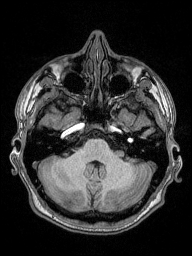
[im 59/176]
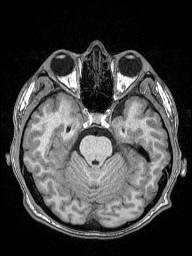
[im 73/176]
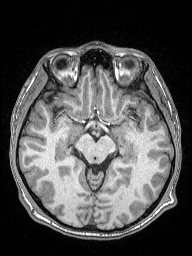
[im 88/176]
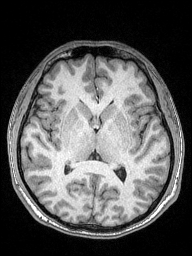
[im 103/176]
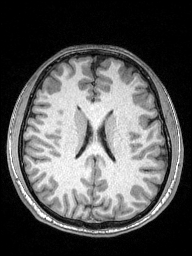
[im 117/176]
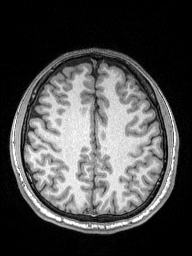
[im 132/176]
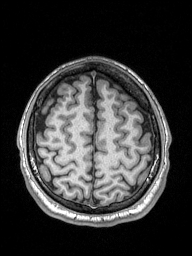
[im 146/176]
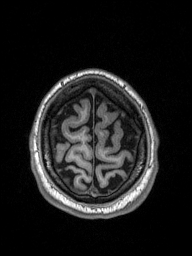
[im 161/176]
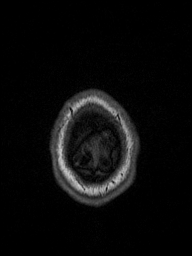
[im 176/176]
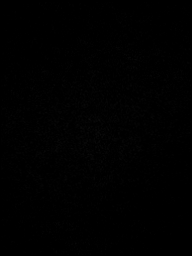

[Series 11: T2 · coronal · 5.0mm · 0.49mm/px · 2 of 29 slices shown (3 of 3)]
[im 1/29]
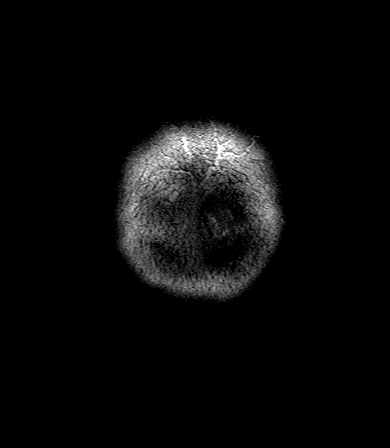
[im 29/29]
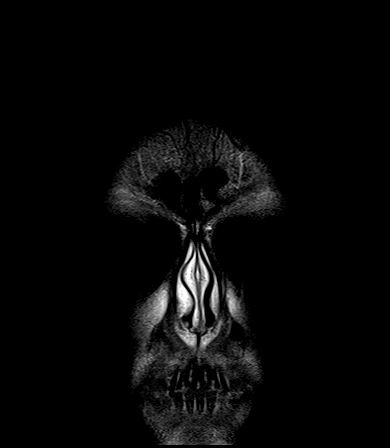

[Series 100: ax (id) · axial · 3.0mm · 1.80mm/px · z∈[-51,+117]mm · 12 of 171 slices shown]
[im 1/171]
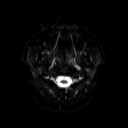
[im 16/171]
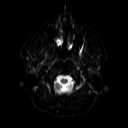
[im 31/171]
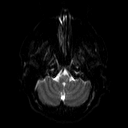
[im 47/171]
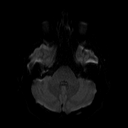
[im 62/171]
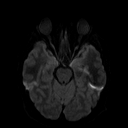
[im 78/171]
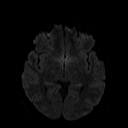
[im 93/171]
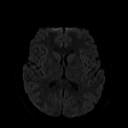
[im 109/171]
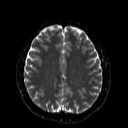
[im 124/171]
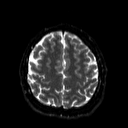
[im 140/171]
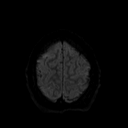
[im 155/171]
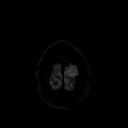
[im 171/171]
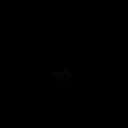

[Series 101: cor (id) · coronal · 3.0mm · 1.80mm/px · 3 of 47 slices shown]
[im 1/47]
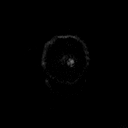
[im 24/47]
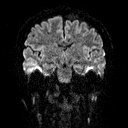
[im 47/47]
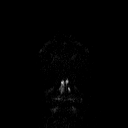

[48 of 48 positions shown; findings below may reference images not displayed]

FINDINGS: Brain: No evidence for acute infarction, hemorrhage, mass lesion,
hydrocephalus, or extra-axial fluid. Normal cerebral volume. No
white matter disease. Specifically no foci of T2/FLAIR
hyperintensity to suggest complicated migraine, chronic infection,
small vessel disease, vasculitis, or other significant pathology.

Vascular: Normal flow voids.

Skull and upper cervical spine: Unremarkable visualized calvarium,
skullbase, and cervical vertebrae. Pituitary, pineal, cerebellar
tonsils unremarkable. No upper cervical cord lesions.

Sinuses/Orbits: No orbital masses or proptosis. Globes appear
symmetric. Sinuses appear well aerated, without evidence for
air-fluid level.

Other: No nasopharyngeal pathology or mastoid fluid. Scalp and other
visualized extracranial soft tissues grossly unremarkable.
Incidental LEFT lateral retropharyngeal lymph node, not enlarged by
short axis measurement.
IMPRESSION: Negative exam.  No intracranial pathology is evident.

No evidence for complicated migraine.

## 2019-02-19 ENCOUNTER — Encounter: Payer: Self-pay | Admitting: Neurology

## 2019-02-22 ENCOUNTER — Ambulatory Visit: Payer: PRIVATE HEALTH INSURANCE | Admitting: Neurology

## 2019-05-26 ENCOUNTER — Other Ambulatory Visit: Payer: Self-pay

## 2019-05-26 ENCOUNTER — Ambulatory Visit (INDEPENDENT_AMBULATORY_CARE_PROVIDER_SITE_OTHER): Payer: PRIVATE HEALTH INSURANCE | Admitting: Internal Medicine

## 2019-05-26 ENCOUNTER — Encounter: Payer: Self-pay | Admitting: Internal Medicine

## 2019-05-26 DIAGNOSIS — Z0000 Encounter for general adult medical examination without abnormal findings: Secondary | ICD-10-CM

## 2019-05-26 DIAGNOSIS — F419 Anxiety disorder, unspecified: Secondary | ICD-10-CM

## 2019-05-26 DIAGNOSIS — G43909 Migraine, unspecified, not intractable, without status migrainosus: Secondary | ICD-10-CM | POA: Diagnosis not present

## 2019-05-26 DIAGNOSIS — Z1389 Encounter for screening for other disorder: Secondary | ICD-10-CM

## 2019-05-26 DIAGNOSIS — Z13220 Encounter for screening for lipoid disorders: Secondary | ICD-10-CM | POA: Diagnosis not present

## 2019-05-26 DIAGNOSIS — Z1329 Encounter for screening for other suspected endocrine disorder: Secondary | ICD-10-CM

## 2019-05-26 DIAGNOSIS — E559 Vitamin D deficiency, unspecified: Secondary | ICD-10-CM

## 2019-05-26 MED ORDER — NORTRIPTYLINE HCL 25 MG PO CAPS: 25.0000 mg | ORAL_CAPSULE | Freq: Every day | ORAL | 1 refills | Status: DC

## 2019-05-26 MED ORDER — SUMATRIPTAN SUCCINATE 100 MG PO TABS: ORAL_TABLET | ORAL | 11 refills | Status: DC

## 2019-05-26 MED ORDER — BUSPIRONE HCL 15 MG PO TABS: 15.0000 mg | ORAL_TABLET | Freq: Two times a day (BID) | ORAL | 12 refills | Status: DC

## 2019-05-26 NOTE — Progress Notes (Signed)
Virtual Visit via Video Note  I connected with Donald Lane   on 05/26/19 at  1:30 PM EDT by a video enabled telemedicine application and verified that I am speaking with the correct person using two identifiers.  Location patient: home Location provider:work  Persons participating in the virtual visit: patient, provider 21 y.o dog   I discussed the limitations of evaluation and management by telemedicine and the availability of in person appointments. The patient expressed understanding and agreed to proceed.   HPI: 1. Anxiety increased due to being a work and his boss does not believe in masks and will not let him wear a mask He has surgical masks and cloth masks with filters he would like to wear  On buspar 10 mg bid not helping  He is considering therapy  2. Migraines induced by stress at times needs refills of imitrex 100 mg and nortriptyline 25 mg qhs  3. Annual   ROS: See pertinent positives and negatives per HPI. General: wt stable  HEENT: no sore throat, ear pain resolved  CV: no chest pain  Lungs: no sob  Msk: no jt pain  GI: no ab pain  Neuro: no h/a today Psych: mood ok anxiety increased  Skin: no issues  GU: no issues   Past Medical History:  Diagnosis Date  . Anxiety   . Frequent headaches   . Polycythemia     Past Surgical History:  Procedure Laterality Date  . TONSILLECTOMY AND ADENOIDECTOMY  2008  . WISDOM TOOTH EXTRACTION      Family History  Problem Relation Age of Onset  . Alcohol abuse Paternal Aunt   . Diabetes Father   . Diabetes Maternal Grandmother   . Migraines Maternal Grandfather   . Cancer Paternal Grandfather     SOCIAL HX: lives with GF and dog    Current Outpatient Medications:  .  aspirin-acetaminophen-caffeine (MIGRAINE FORMULA) 250-250-65 MG tablet, Take by mouth every 6 (six) hours as needed for headache. Excedrin migraine, Disp: , Rfl:  .  busPIRone (BUSPAR) 15 MG tablet, Take 1 tablet (15 mg total) by mouth 2 (two)  times daily., Disp: 60 tablet, Rfl: 12 .  ibuprofen (ADVIL,MOTRIN) 200 MG tablet, Take 200 mg by mouth every 6 (six) hours as needed., Disp: , Rfl:  .  Lidocaine-Glycerin (PREPARATION H) 5-14.4 % CREA, Place rectally., Disp: , Rfl:  .  neomycin-polymyxin-hydrocortisone (CORTISPORIN) OTIC solution, Place 4 drops into both ears 3 (three) times daily. X 1 week, Disp: 10 mL, Rfl: 0 .  nortriptyline (PAMELOR) 25 MG capsule, Take 1 capsule (25 mg total) by mouth at bedtime., Disp: 90 capsule, Rfl: 1 .  phenylephrine-shark liver oil-mineral oil-petrolatum (PREPARATION H) 0.25-14-74.9 % rectal ointment, Place 1 application rectally 2 (two) times daily as needed for hemorrhoids., Disp: , Rfl:  .  scopolamine (TRANSDERM-SCOP) 1 MG/3DAYS, Place 1 patch (1.5 mg total) onto the skin every 3 (three) days. Apply >4 hrs before cruise do not cut. Change every 3 days, Disp: 4 patch, Rfl: 0 .  SUMAtriptan (IMITREX) 100 MG tablet, Take 1 tablet earliest onset of headache.  May repeat x1 in 2 hours if headache persists or recurs.  Do not exceed 2 tablets in 24 hours, Disp: 10 tablet, Rfl: 11  EXAM:  VITALS per patient if applicable:  GENERAL: alert, oriented, appears well and in no acute distress  HEENT: atraumatic, conjunttiva clear, no obvious abnormalities on inspection of external nose and ears  NECK: normal movements of the head and neck  LUNGS: on inspection no signs of respiratory distress, breathing rate appears normal, no obvious gross SOB, gasping or wheezing  CV: no obvious cyanosis  MS: moves all visible extremities without noticeable abnormality  PSYCH/NEURO: pleasant and cooperative, no obvious depression or anxiety, speech and thought processing grossly intact  ASSESSMENT AND PLAN:  Discussed the following assessment and plan:  Annual physical exam Flu shot and Tdap utd  Hep B immune  MMR immune   HPV vaccines (had) STD neg  [facility] [facility] referred today [facility]   Check labs 10/2019   Migraine without status migrainosus, not intractable, unspecified migraine type - Plan: SUMAtriptan (IMITREX) 100 MG tablet, nortriptyline (PAMELOR) 25 MG capsule qhs  F/u neurology   Anxiety - Plan: busPIRone (BUSPAR) 15 MG tablet bid increased from 10 mg bid  Note for work to wear a mask  He is considering therapy and will let me know if referal needed      I discussed the assessment and treatment plan with the patient. The patient was provided an opportunity to ask questions and all were answered. The patient agreed with the plan and demonstrated an understanding of the instructions.   The patient was advised to call back or seek an in-person evaluation if the symptoms worsen or if the condition fails to improve as anticipated.  Time spent 15 minutes  Delorise Jackson, MD

## 2019-06-26 ENCOUNTER — Other Ambulatory Visit: Payer: Self-pay | Admitting: Internal Medicine

## 2019-06-26 DIAGNOSIS — F419 Anxiety disorder, unspecified: Secondary | ICD-10-CM

## 2019-07-07 ENCOUNTER — Other Ambulatory Visit: Payer: Self-pay

## 2019-07-07 DIAGNOSIS — Z20822 Contact with and (suspected) exposure to covid-19: Secondary | ICD-10-CM

## 2019-07-08 LAB — NOVEL CORONAVIRUS, NAA: SARS-CoV-2, NAA: NOT DETECTED

## 2019-11-10 ENCOUNTER — Ambulatory Visit (INDEPENDENT_AMBULATORY_CARE_PROVIDER_SITE_OTHER): Payer: Self-pay | Admitting: Internal Medicine

## 2019-11-10 ENCOUNTER — Other Ambulatory Visit: Payer: Self-pay

## 2019-11-10 VITALS — Ht 72.0 in | Wt 175.0 lb

## 2019-11-10 DIAGNOSIS — J329 Chronic sinusitis, unspecified: Secondary | ICD-10-CM

## 2019-11-10 DIAGNOSIS — H9201 Otalgia, right ear: Secondary | ICD-10-CM

## 2019-11-10 MED ORDER — AMOXICILLIN-POT CLAVULANATE 875-125 MG PO TABS: 1.0000 | ORAL_TABLET | Freq: Two times a day (BID) | ORAL | 0 refills | Status: DC

## 2019-11-10 MED ORDER — NEOMYCIN-POLYMYXIN-HC 3.5-10000-1 OT SOLN: 4.0000 [drp] | Freq: Four times a day (QID) | OTIC | 0 refills | Status: DC

## 2019-11-10 NOTE — Progress Notes (Signed)
Virtual Visit via Video Note  I connected with Donald Lane  on 11/10/19 at  2:30 PM EST by a video enabled telemedicine application and verified that I am speaking with the correct person using two identifiers.  Location patient: home Location provider:work or home office Persons participating in the virtual visit: patient, provider  I discussed the limitations of evaluation and management by telemedicine and the availability of in person appointments. The patient expressed understanding and agreed to proceed.   HPI: 1. H/o right ear pain 09/2018 given amoxil and steroids and neomycin otic now ear pain back no drainage using ear plugs foam when going into building with loud nose. Ear pain x 1 week 5/10 ringing and dizzy in the am with sinus issues I.e pressure and increased h/a as well and some nausea. No bloody discharge from ear or hearing loss but he does have some popping in his ear. No fever. No covid 19 sx's but GF + covid 19 06/2019 and he was tested 07/07/19 and negative, He has not tried any nose sprays or antihistamines but has some of GF's at home and may try for sinus issues  Discussed flonase, nasal saline and I.e claritin, allegra, zyrtec he tried benadryl in the past which made him sleepy   ROS: See pertinent positives and negatives per HPI.  Past Medical History:  Diagnosis Date  . Anxiety   . Frequent headaches   . Polycythemia     Past Surgical History:  Procedure Laterality Date  . TONSILLECTOMY AND ADENOIDECTOMY  2008  . WISDOM TOOTH EXTRACTION      Family History  Problem Relation Age of Onset  . Alcohol abuse Paternal Aunt   . Diabetes Father   . Diabetes Maternal Grandmother   . Migraines Maternal Grandfather   . Cancer Paternal Grandfather     SOCIAL HX:   Single, lives with GF in a 2 story home. Has 1 dog Has an occasional coffee or soda. Drinks approximately 12oz of hot tea a day. Lives with GF  Current Outpatient Medications:  .   aspirin-acetaminophen-caffeine (MIGRAINE FORMULA) 250-250-65 MG tablet, Take by mouth every 6 (six) hours as needed for headache. Excedrin migraine, Disp: , Rfl:  .  busPIRone (BUSPAR) 15 MG tablet, TAKE 1 TABLET (15 MG TOTAL) BY MOUTH 2 (TWO) TIMES DAILY. **DISCONTINUE 10 MG DOSE**, Disp: 180 tablet, Rfl: 5 .  ibuprofen (ADVIL,MOTRIN) 200 MG tablet, Take 200 mg by mouth every 6 (six) hours as needed., Disp: , Rfl:  .  Lidocaine-Glycerin (PREPARATION H) 5-14.4 % CREA, Place rectally., Disp: , Rfl:  .  nortriptyline (PAMELOR) 25 MG capsule, Take 1 capsule (25 mg total) by mouth at bedtime., Disp: 90 capsule, Rfl: 1 .  phenylephrine-shark liver oil-mineral oil-petrolatum (PREPARATION H) 0.25-14-74.9 % rectal ointment, Place 1 application rectally 2 (two) times daily as needed for hemorrhoids., Disp: , Rfl:  .  scopolamine (TRANSDERM-SCOP) 1 MG/3DAYS, Place 1 patch (1.5 mg total) onto the skin every 3 (three) days. Apply >4 hrs before cruise do not cut. Change every 3 days, Disp: 4 patch, Rfl: 0 .  SUMAtriptan (IMITREX) 100 MG tablet, Take 1 tablet earliest onset of headache.  May repeat x1 in 2 hours if headache persists or recurs.  Do not exceed 2 tablets in 24 hours, Disp: 10 tablet, Rfl: 11 .  amoxicillin-clavulanate (AUGMENTIN) 875-125 MG tablet, Take 1 tablet by mouth 2 (two) times daily. With food, Disp: 14 tablet, Rfl: 0 .  neomycin-polymyxin-hydrocortisone (CORTISPORIN) OTIC solution, Place 4  drops into the right ear 4 (four) times daily. x4-7 days, Disp: 10 mL, Rfl: 0  EXAM:  VITALS per patient if applicable:  GENERAL: alert, oriented, appears well and in no acute distress  HEENT: atraumatic, conjunttiva clear, no obvious abnormalities on inspection of external nose and ears  NECK: normal movements of the head and neck  LUNGS: on inspection no signs of respiratory distress, breathing rate appears normal, no obvious gross SOB, gasping or wheezing  CV: no obvious cyanosis  MS: moves all  visible extremities without noticeable abnormality  PSYCH/NEURO: pleasant and cooperative, no obvious depression or anxiety, speech and thought processing grossly intact  ASSESSMENT AND PLAN:  Discussed the following assessment and plan:  Right ear pain - Plan: neomycin-polymyxin-hydrocortisone (CORTISPORIN) OTIC solution, amoxicillin-clavulanate (AUGMENTIN) 875-125 MG tablet -if right ear not better refer ENT  -stop foam ear plugs for now   Sinusitis, unspecified chronicity, unspecified location rec NS, flonase, otc AH as well   -we discussed possible serious and likely etiologies, options for evaluation and workup, limitations of telemedicine visit vs in person visit, treatment, treatment risks and precautions. Pt prefers to treat via telemedicine empirically rather then risking or undertaking an in person visit at this moment. Patient agrees to seek prompt in person care if worsening, new symptoms arise, or if is not improving with treatment.   I discussed the assessment and treatment plan with the patient. The patient was provided an opportunity to ask questions and all were answered. The patient agreed with the plan and demonstrated an understanding of the instructions.   The patient was advised to call back or seek an in-person evaluation if the symptoms worsen or if the condition fails to improve as anticipated.  Time spent 20 minutes  Bevelyn Buckles, MD

## 2019-11-11 ENCOUNTER — Encounter: Payer: Self-pay | Admitting: Internal Medicine

## 2019-11-15 ENCOUNTER — Other Ambulatory Visit: Payer: Self-pay | Admitting: Internal Medicine

## 2019-11-15 ENCOUNTER — Encounter: Payer: Self-pay | Admitting: Internal Medicine

## 2019-11-15 DIAGNOSIS — H9201 Otalgia, right ear: Secondary | ICD-10-CM

## 2019-11-29 ENCOUNTER — Other Ambulatory Visit: Payer: Self-pay

## 2019-11-29 ENCOUNTER — Other Ambulatory Visit: Payer: PRIVATE HEALTH INSURANCE

## 2019-11-29 ENCOUNTER — Other Ambulatory Visit (INDEPENDENT_AMBULATORY_CARE_PROVIDER_SITE_OTHER): Payer: No Typology Code available for payment source

## 2019-11-29 DIAGNOSIS — Z1329 Encounter for screening for other suspected endocrine disorder: Secondary | ICD-10-CM | POA: Diagnosis not present

## 2019-11-29 DIAGNOSIS — Z13220 Encounter for screening for lipoid disorders: Secondary | ICD-10-CM

## 2019-11-29 DIAGNOSIS — Z1389 Encounter for screening for other disorder: Secondary | ICD-10-CM

## 2019-11-29 DIAGNOSIS — Z0000 Encounter for general adult medical examination without abnormal findings: Secondary | ICD-10-CM

## 2019-11-29 DIAGNOSIS — E559 Vitamin D deficiency, unspecified: Secondary | ICD-10-CM

## 2019-11-30 LAB — LIPID PANEL
Cholesterol: 145 mg/dL (ref 0–200)
HDL: 37.5 mg/dL — ABNORMAL LOW (ref >39.00)
LDL Cholesterol: 71 mg/dL (ref 0–99)
NonHDL: 107.19
Total CHOL/HDL Ratio: 4
Triglycerides: 182 mg/dL — ABNORMAL HIGH (ref 0.0–149.0)
VLDL: 36.4 mg/dL (ref 0.0–40.0)

## 2019-11-30 LAB — CBC WITH DIFFERENTIAL/PLATELET
Basophils Absolute: 0 10*3/uL (ref 0.0–0.1)
Basophils Relative: 0.6 % (ref 0.0–3.0)
Eosinophils Absolute: 0 10*3/uL (ref 0.0–0.7)
Eosinophils Relative: 0.9 % (ref 0.0–5.0)
HCT: 49.1 % (ref 39.0–52.0)
Hemoglobin: 17.2 g/dL — ABNORMAL HIGH (ref 13.0–17.0)
Lymphocytes Relative: 42.7 % (ref 12.0–46.0)
Lymphs Abs: 2.2 10*3/uL (ref 0.7–4.0)
MCHC: 35 g/dL (ref 30.0–36.0)
MCV: 88.9 fl (ref 78.0–100.0)
Monocytes Absolute: 0.7 10*3/uL (ref 0.1–1.0)
Monocytes Relative: 12.9 % — ABNORMAL HIGH (ref 3.0–12.0)
Neutro Abs: 2.3 10*3/uL (ref 1.4–7.7)
Neutrophils Relative %: 42.9 % — ABNORMAL LOW (ref 43.0–77.0)
Platelets: 180 10*3/uL (ref 150.0–400.0)
RBC: 5.53 Mil/uL (ref 4.22–5.81)
RDW: 12.2 % (ref 11.5–15.5)
WBC: 5.3 10*3/uL (ref 4.0–10.5)

## 2019-11-30 LAB — URINALYSIS, ROUTINE W REFLEX MICROSCOPIC
Bacteria, UA: NONE SEEN /HPF
Bilirubin Urine: NEGATIVE
Glucose, UA: NEGATIVE
Hgb urine dipstick: NEGATIVE
Hyaline Cast: NONE SEEN /LPF
Leukocytes,Ua: NEGATIVE
Nitrite: NEGATIVE
RBC / HPF: NONE SEEN /HPF (ref 0–2)
Specific Gravity, Urine: 1.022 (ref 1.001–1.03)
WBC, UA: NONE SEEN /HPF (ref 0–5)
pH: 8 (ref 5.0–8.0)

## 2019-11-30 LAB — VITAMIN D 25 HYDROXY (VIT D DEFICIENCY, FRACTURES): VITD: 28.55 ng/mL — ABNORMAL LOW (ref 30.00–100.00)

## 2019-11-30 LAB — COMPREHENSIVE METABOLIC PANEL
ALT: 16 U/L (ref 0–53)
AST: 16 U/L (ref 0–37)
Albumin: 5 g/dL (ref 3.5–5.2)
Alkaline Phosphatase: 80 U/L (ref 39–117)
BUN: 9 mg/dL (ref 6–23)
CO2: 29 mEq/L (ref 19–32)
Calcium: 10.3 mg/dL (ref 8.4–10.5)
Chloride: 101 mEq/L (ref 96–112)
Creatinine, Ser: 1.08 mg/dL (ref 0.40–1.50)
GFR: 86.06 mL/min (ref >60.00)
Glucose, Bld: 85 mg/dL (ref 70–99)
Potassium: 4.3 mEq/L (ref 3.5–5.1)
Sodium: 138 mEq/L (ref 135–145)
Total Bilirubin: 0.6 mg/dL (ref 0.2–1.2)
Total Protein: 7.6 g/dL (ref 6.0–8.3)

## 2019-11-30 LAB — TSH: TSH: 1.26 u[IU]/mL (ref 0.35–4.50)

## 2019-12-01 ENCOUNTER — Encounter: Payer: Self-pay | Admitting: Internal Medicine

## 2019-12-01 NOTE — Patient Instructions (Signed)
Jaw Range of Motion Exercises Jaw range of motion exercises are exercises that help your jaw move better. Exercises that help you have good posture (postural exercises) also help relieve jaw discomfort. These are often done along with range of motion exercises. These exercises can help prevent or improve:  Difficulty opening your mouth.  Pain in your jaw while it is open or closed.  Temporomandibular joint (TMJ) pain.  Headache caused by jaw tension. Take other actions to prevent or relieve jaw pain, such as:  Avoiding things that cause or increase jaw pain. This may include: ? Chewing gum or eating hard foods. ? Clenching your jaw or teeth, grinding your teeth, or keeping tension in your jaw muscles. ? Opening your mouth wide, such as for a big yawn. ? Leaning on your jaw, such as resting your jaw in your hand while leaning on a desk.  Putting ice on your jaw. ? Put ice in a plastic bag. ? Place a towel between your skin and the bag. ? Leave the ice on for 10-15 minutes, 2-3 times a day. Only do jaw exercises that your health care provider approves of. Only move your jaw as far as it can comfortably go in each direction. Do not move your jaw into positions that cause pain. Range of motion exercises Repeat each of these exercises 8 times, 1-2 times a day, or as told by your health care provider. Exercise A: Forward protrusion 1. Push your jaw forward. Hold this position for 1-2 seconds. 2. Allow your jaw to return to its normal position and rest it there for 1-2 seconds. Exercise B: Controlled opening 1. Stand or sit in front of a mirror. Place your tongue on the roof of your mouth, just behind your top teeth. 2. Keeping your tongue on the roof of your mouth, slowly open and close your mouth. 3. While you open and close your mouth, watch your jaw in the mirror. Try to keep your jaw from moving to one side or the other. Exercise C: Right and left motion 1. Move your jaw right. Hold  this position for 1-2 seconds. Allow your jaw to return to its normal position, and rest it there for 1-2 seconds. 2. Move your jaw left. Hold this position for 1-2 seconds. Allow your jaw to return to its normal position, and rest it there for 1-2 seconds. Postural exercises Exercise A: Chin tucks 1. You can do this exercise sitting, standing, or lying down. 2. Move your head straight back, keeping your head level. You can guide the movement by placing your fingers on your chin to push your jaw back in an even motion. You should be able to feel a double chin form at the end of the motion. 3. Hold this position for 5 seconds. Repeat 10-15 times. Exercise B: Shoulder blade squeeze 1. Sit or stand. 2. Bend your elbows to about 90 degrees, which is the shape of a capital letter "L." Keep your upper arms by your body. 3. Squeeze your shoulder blades down and back, as though you were trying to touch your elbows behind you. Do not shrug your shoulders or move your head. 4. Hold this position for 5 seconds. Repeat 10-15 times. Exercise C: Chest stretch 1. Stand facing a corner. 2. Put both of your hands and your forearms on the wall, with your arms wide apart. 3. Make sure your arms are at a 90-degree angle to your body. This means that you should hold your arms straight   out from your body, level with the floor. 4. Step in toward the corner. Do not lean in. 5. Hold this position for 30 seconds. Repeat 3 times. Contact a health care provider if you have:  Jaw pain that is new or gets worse.  Clicking or popping sounds while doing the exercises. Get help right away if:  Your jaw is stuck in one place and you cannot move it.  You cannot open or close your mouth. This information is not intended to replace advice given to you by your health care provider. Make sure you discuss any questions you have with your health care provider. Document Revised: 02/05/2019 Document Reviewed: 09/10/2017 Elsevier  Patient Education  2020 [employer] Temporomandibular Joint Syndrome  Temporomandibular joint syndrome (TMJ syndrome) is a condition that causes pain in the temporomandibular joints. These joints are located near your ears and allow your jaw to open and close. For people with TMJ syndrome, chewing, biting, or other movements of the jaw can be difficult or painful. TMJ syndrome is often mild and goes away within a few weeks. However, sometimes the condition becomes a long-term (chronic) problem. What are the causes? This condition may be caused by:  Grinding your teeth or clenching your jaw. Some people do this when they are under stress.  Arthritis.  Injury to the jaw.  Head or neck injury.  Teeth or dentures that are not aligned well. In some cases, the cause of TMJ syndrome may not be known. What are the signs or symptoms? The most common symptom of this condition is an aching pain on the side of the head in the area of the TMJ. Other symptoms may include:  Pain when moving your jaw, such as when chewing or biting.  Being unable to open your jaw all the way.  Making a clicking sound when you open your mouth.  Headache.  Earache.  Neck or shoulder pain. How is this diagnosed? This condition may be diagnosed based on:  Your symptoms and medical history.  A physical exam. Your health care provider may check the range of motion of your jaw.  Imaging tests, such as X-rays or an MRI. You may also need to see your dentist, who will determine if your teeth and jaw are lined up correctly. How is this treated? TMJ syndrome often goes away on its own. If treatment is needed, the options may include:  Eating soft foods and applying ice or heat.  Medicines to relieve pain or inflammation.  Medicines or massage to relax the muscles.  A splint, bite plate, or mouthpiece to prevent teeth grinding or jaw clenching.  Relaxation techniques or counseling to help reduce  stress.  A therapy for pain in which an electrical current is applied to the nerves through the skin (transcutaneous electrical nerve stimulation).  Acupuncture. This is sometimes helpful to relieve pain.  Jaw surgery. This is rarely needed. Follow these instructions at home:  Eating and drinking  Eat a soft diet if you are having trouble chewing.  Avoid foods that require a lot of chewing. Do not chew gum. General instructions  Take over-the-counter and prescription medicines only as told by your health care provider.  If directed, put ice on the painful area. ? Put ice in a plastic bag. ? Place a towel between your skin and the bag. ? Leave the ice on for 20 minutes, 2-3 times a day.  Apply a warm, wet cloth (warm compress) to the painful area as directed.    Massage your jaw area and do any jaw stretching exercises as told by your health care provider.  If you were given a splint, bite plate, or mouthpiece, wear it as told by your health care provider.  Keep all follow-up visits as told by your health care provider. This is important. Contact a health care provider if:  You are having trouble eating.  You have new or worsening symptoms. Get help right away if:  Your jaw locks open or closed. Summary  Temporomandibular joint syndrome (TMJ syndrome) is a condition that causes pain in the temporomandibular joints. These joints are located near your ears and allow your jaw to open and close.  TMJ syndrome is often mild and goes away within a few weeks. However, sometimes the condition becomes a long-term (chronic) problem.  Symptoms include an aching pain on the side of the head in the area of the TMJ, pain when chewing or biting, and being unable to open your jaw all the way. You may also make a clicking sound when you open your mouth.  TMJ syndrome often goes away on its own. If treatment is needed, it may include medicines to relieve pain, reduce inflammation, or relax  the muscles. A splint, bite plate, or mouthpiece may also be used to prevent teeth grinding or jaw clenching. This information is not intended to replace advice given to you by your health care provider. Make sure you discuss any questions you have with your health care provider. Document Revised: 12/26/2017 Document Reviewed: 11/25/2017 Elsevier Patient Education  2020 [employer] Inc.  

## 2019-12-07 ENCOUNTER — Ambulatory Visit: Payer: PRIVATE HEALTH INSURANCE | Admitting: Internal Medicine

## 2019-12-24 ENCOUNTER — Other Ambulatory Visit: Payer: Self-pay

## 2019-12-24 ENCOUNTER — Encounter: Payer: Self-pay | Admitting: Internal Medicine

## 2019-12-24 ENCOUNTER — Ambulatory Visit (INDEPENDENT_AMBULATORY_CARE_PROVIDER_SITE_OTHER): Payer: No Typology Code available for payment source | Admitting: Internal Medicine

## 2019-12-24 VITALS — BP 116/80 | HR 114 | Temp 98.2°F | Ht 72.0 in | Wt 174.8 lb

## 2019-12-24 DIAGNOSIS — G43909 Migraine, unspecified, not intractable, without status migrainosus: Secondary | ICD-10-CM

## 2019-12-24 DIAGNOSIS — F419 Anxiety disorder, unspecified: Secondary | ICD-10-CM | POA: Diagnosis not present

## 2019-12-24 DIAGNOSIS — D751 Secondary polycythemia: Secondary | ICD-10-CM | POA: Diagnosis not present

## 2019-12-24 DIAGNOSIS — H9209 Otalgia, unspecified ear: Secondary | ICD-10-CM

## 2019-12-24 DIAGNOSIS — E559 Vitamin D deficiency, unspecified: Secondary | ICD-10-CM

## 2019-12-24 MED ORDER — NORTRIPTYLINE HCL 25 MG PO CAPS: 25.0000 mg | ORAL_CAPSULE | Freq: Every day | ORAL | 3 refills | Status: DC

## 2019-12-24 NOTE — Progress Notes (Addendum)
Chief Complaint  Patient presents with  . Follow-up   F/u  1. Ear pain better and ENT thought was related to TMJ. He has not yet f/u with dental  2. H/a/migraines improved on imitrex 100 mg prn and nortriptyline 25 mg qhs. He f/u with Dr. Tomi Likens neurology prn  3. Anxiety increased but managing on buspar 15 mg bid  4. Review of labs and hemoglobin increased no exposure to carbon monoxide or high altitude. Jak 2 negative in the past. He will check with insurance who is in network for h/o for further w/u   10/14/2017 13:5o Hemoglobin: 17.3 (H) HCT: 49.2 (H)  11/25/2017 13:18 Hemoglobin: 17.4 (H) HCT: 49.3 (H)  05/25/2018 13:28 Hemoglobin: 17.1 (H) HCT: 49.1 (H)  11/25/2018 13:40 Hemoglobin: 16.9 HCT: 48.5  11/29/2019 15:27 Hemoglobin: 17.2 (H) HCT: 49.1   Review of Systems  Constitutional:       Wt stable   HENT: Negative for ear pain.   Eyes: Negative for blurred vision.  Respiratory: Negative for shortness of breath.   Cardiovascular: Negative for chest pain.  Gastrointestinal: Negative for abdominal pain.  Musculoskeletal: Negative for falls.  Skin: Negative for rash.  Neurological: Negative for headaches.  Psychiatric/Behavioral: The patient is nervous/anxious.    Past Medical History:  Diagnosis Date  . Anxiety   . Frequent headaches   . Polycythemia    Past Surgical History:  Procedure Laterality Date  . TONSILLECTOMY AND ADENOIDECTOMY  2008  . WISDOM TOOTH EXTRACTION     Family History  Problem Relation Age of Onset  . Alcohol abuse Paternal Aunt   . Diabetes Father   . Diabetes Maternal Grandmother   . Migraines Maternal Grandfather   . Cancer Paternal Grandfather    Social History   Socioeconomic History  . Marital status: Single    Spouse name: Not on file  . Number of children: Not on file  . Years of education: Not on file  . Highest education level: 12th grade  Occupational History  . 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 Smoking status: Never Smoker  . Smokeless tobacco: Never Used  Substance and Sexual Activity  . Alcohol use: No    Alcohol/week: 0.0 standard drinks  . Drug use: No  . Sexual activity: Not on file  Other Topics Concern  . Not on file  Social History Narrative   Single, lives with GF in a 2 story home. Has 1 dog   Has an occasional coffee or soda. Drinks approximately 12oz of hot tea a day.   Lives with GF   Social Determinants of Health   Financial Resource Strain:   . Difficulty of Paying Living Expenses: Not on file  Food Insecurity:   . Worried About Charity fundraiser in the Last Year: Not on file  . Ran Out of Food in the Last Year: Not on file  Transportation Needs:   . Lack of Transportation (Medical): Not on file  . Lack of Transportation (Non-Medical): Not on file  Physical Activity:   . Days of Exercise per Week: Not on file  . Minutes of Exercise per Session: Not on file  Stress:   . Feeling of Stress : Not on file  Social Connections:   . Frequency of Communication with Friends and Family: Not on file  . Frequency of Social Gatherings with Friends and Family: Not on file  . Attends Religious Services: Not on file  . Active  Member of Clubs or Organizations: Not on file  . Attends Archivist Meetings: Not on file  . Marital Status: Not on file  Intimate Partner Violence:   . Fear of Current or Ex-Partner: Not on file  . Emotionally Abused: Not on file  . Physically Abused: Not on file  . Sexually Abused: Not on file   Current Meds  Medication Sig  . aspirin-acetaminophen-caffeine (MIGRAINE FORMULA) 250-250-65 MG tablet Take by mouth every 6 (six) hours as needed for headache. Excedrin migraine  . busPIRone (BUSPAR) 15 MG tablet TAKE 1 TABLET (15 MG TOTAL) BY MOUTH 2 (TWO) TIMES DAILY. **DISCONTINUE 10 MG DOSE**  . ibuprofen (ADVIL,MOTRIN) 200 MG tablet Take 200 mg by mouth every 6 (six) hours as needed.  . nortriptyline (PAMELOR) 25 MG  capsule Take 1 capsule (25 mg total) by mouth at bedtime.  . SUMAtriptan (IMITREX) 100 MG tablet Take 1 tablet earliest onset of headache.  May repeat x1 in 2 hours if headache persists or recurs.  Do not exceed 2 tablets in 24 hours  . [DISCONTINUED] nortriptyline (PAMELOR) 25 MG capsule Take 1 capsule (25 mg total) by mouth at bedtime.   No Known Allergies Recent Results (from the past 2160 hour(s))  Vitamin D (25 hydroxy)     Status: Abnormal   Collection Time: 11/29/19  3:27 PM  Result Value Ref Range   VITD 28.55 (L) 30.00 - 100.00 ng/mL  Urinalysis, Routine w reflex microscopic     Status: Abnormal   Collection Time: 11/29/19  3:27 PM  Result Value Ref Range   Color, Urine YELLOW YELLOW   APPearance CLEAR CLEAR   Specific Gravity, Urine 1.022 1.001 - 1.03   pH 8.0 5.0 - 8.0   Glucose, UA NEGATIVE NEGATIVE   Bilirubin Urine NEGATIVE NEGATIVE   Ketones, ur TRACE (A) NEGATIVE   Hgb urine dipstick NEGATIVE NEGATIVE   Protein, ur 1+ (A) NEGATIVE   Nitrite NEGATIVE NEGATIVE   Leukocytes,Ua NEGATIVE NEGATIVE   WBC, UA NONE SEEN 0 - 5 /HPF   RBC / HPF NONE SEEN 0 - 2 /HPF   Squamous Epithelial / LPF 0-5 < OR = 5 /HPF   Bacteria, UA NONE SEEN NONE SEEN /HPF   Hyaline Cast NONE SEEN NONE SEEN /LPF   Urine-Other FEW MUCOUS THREADS   TSH     Status: None   Collection Time: 11/29/19  3:27 PM  Result Value Ref Range   TSH 1.26 0.35 - 4.50 uIU/mL  Lipid panel     Status: Abnormal   Collection Time: 11/29/19  3:27 PM  Result Value Ref Range   Cholesterol 145 0 - 200 mg/dL    Comment: ATP III Classification       Desirable:  < 200 mg/dL               Borderline High:  200 - 239 mg/dL          High:  > = 240 mg/dL   Triglycerides 182.0 (H) 0.0 - 149.0 mg/dL    Comment: Normal:  <150 mg/dLBorderline High:  150 - 199 mg/dL   HDL 37.50 (L) >39.00 mg/dL   VLDL 36.4 0.0 - 40.0 mg/dL   LDL Cholesterol 71 0 - 99 mg/dL   Total CHOL/HDL Ratio 4     Comment:                Men           Women1/2 Average Risk  3.4          3.3Average Risk          5.0          4.42X Average Risk          9.6          7.13X Average Risk          15.0          11.0                       NonHDL 107.19     Comment: NOTE:  Non-HDL goal should be 30 mg/dL higher than patient's LDL goal (i.e. LDL goal of < 70 mg/dL, would have non-HDL goal of < 100 mg/dL)  CBC with Differential/Platelet     Status: Abnormal   Collection Time: 11/29/19  3:27 PM  Result Value Ref Range   WBC 5.3 4.0 - 10.5 K/uL   RBC 5.53 4.22 - 5.81 Mil/uL   Hemoglobin 17.2 (H) 13.0 - 17.0 g/dL   HCT 49.1 39.0 - 52.0 %   MCV 88.9 78.0 - 100.0 fl   MCHC 35.0 30.0 - 36.0 g/dL   RDW 12.2 11.5 - 15.5 %   Platelets 180.0 150.0 - 400.0 K/uL   Neutrophils Relative % 42.9 (L) 43.0 - 77.0 %   Lymphocytes Relative 42.7 12.0 - 46.0 %   Monocytes Relative 12.9 (H) 3.0 - 12.0 %   Eosinophils Relative 0.9 0.0 - 5.0 %   Basophils Relative 0.6 0.0 - 3.0 %   Neutro Abs 2.3 1.4 - 7.7 K/uL   Lymphs Abs 2.2 0.7 - 4.0 K/uL   Monocytes Absolute 0.7 0.1 - 1.0 K/uL   Eosinophils Absolute 0.0 0.0 - 0.7 K/uL   Basophils Absolute 0.0 0.0 - 0.1 K/uL  Comprehensive metabolic panel     Status: None   Collection Time: 11/29/19  3:27 PM  Result Value Ref Range   Sodium 138 135 - 145 mEq/L   Potassium 4.3 3.5 - 5.1 mEq/L   Chloride 101 96 - 112 mEq/L   CO2 29 19 - 32 mEq/L   Glucose, Bld 85 70 - 99 mg/dL   BUN 9 6 - 23 mg/dL   Creatinine, Ser 1.08 0.40 - 1.50 mg/dL   Total Bilirubin 0.6 0.2 - 1.2 mg/dL   Alkaline Phosphatase 80 39 - 117 U/L   AST 16 0 - 37 U/L   ALT 16 0 - 53 U/L   Total Protein 7.6 6.0 - 8.3 g/dL   Albumin 5.0 3.5 - 5.2 g/dL   GFR 86.06 >60.00 mL/min   Calcium 10.3 8.4 - 10.5 mg/dL   Objective  Body mass index is 23.71 kg/m. Wt Readings from Last 3 Encounters:  12/24/19 174 lb 12.8 oz (79.3 kg)  11/10/19 175 lb (79.4 kg)  11/25/18 181 lb 6 oz (82.3 kg)   Temp Readings from Last 3 Encounters:  12/24/19 98.2 F (36.8  C) (Temporal)  11/25/18 98.3 F (36.8 C) (Oral)  05/25/18 98.1 F (36.7 C)   BP Readings from Last 3 Encounters:  12/24/19 116/80  11/25/18 110/80  08/21/18 124/88   Pulse Readings from Last 3 Encounters:  12/24/19 (!) 114  11/25/18 100  08/21/18 94    Physical Exam Vitals and nursing note reviewed.  Constitutional:      Appearance: Normal appearance. He is well-developed and well-groomed.  HENT:     Head: Normocephalic and atraumatic.  Eyes:  Conjunctiva/sclera: Conjunctivae normal.     Pupils: Pupils are equal, round, and reactive to light.  Cardiovascular:     Rate and Rhythm: Normal rate and regular rhythm.     Heart sounds: Normal heart sounds. No murmur.  Pulmonary:     Effort: Pulmonary effort is normal.     Breath sounds: Normal breath sounds.  Abdominal:     General: Abdomen is flat. Bowel sounds are normal.     Tenderness: There is no abdominal tenderness.  Skin:    General: Skin is warm and dry.  Neurological:     General: No focal deficit present.     Mental Status: He is alert and oriented to person, place, and time. Mental status is at baseline.     Gait: Gait normal.  Psychiatric:        Attention and Perception: Attention and perception normal.        Mood and Affect: Mood and affect normal.        Speech: Speech normal.        Behavior: Behavior normal. Behavior is cooperative.        Thought Content: Thought content normal.        Cognition and Memory: Cognition and memory normal.        Judgment: Judgment normal.     Assessment  Plan  Otalgia saw ENT and thought was 2/2 TMJ F/u dental  Given exercises to do for now ear pain resolved   Migraine without status migrainosus, not intractable, unspecified migraine type - Plan: nortriptyline (PAMELOR) 25 MG capsule imitrex prn  Disc magnesium 250-400 mg daily for prevention as well F/u Dr. Tomi Likens prn  Polycythemia Pt to let me know which h/o in network and rec h/o referral further w/u   Jak 2 negative in the past   Anxiety stable for now Cont meds  GAD 7 socre 7 and PHQ 9 score 7 Replika app, Bloom app, insight timer, calm, headspace   [facility]-CPE at f/u in 05/25/20 will repeat urine Flu shot utd 2020 and Tdap utd 11/25/2018  Hep B immune  MMR immune   HPV vaccines (had) STD neg  [facility] Labs reviewed 11/29/19 given cholesterol handout  rec healthy diet and exercise  rec mvt with vitamin D3 for total in both equal to 5000 IU daily D3   Provider: Dr. Olivia Mackie McLean-Scocuzza-Internal Medicine

## 2019-12-24 NOTE — Patient Instructions (Addendum)
Magnesium 250-400 mg daily  Replika app, Bloom app, insight timer, calm, headspace   Goal heart rate 60 and <100  If >110 consistently let me know      Jaw Range of Motion Exercises Jaw range of motion exercises are exercises that help your jaw move better. Exercises that help you have good posture (postural exercises) also help relieve jaw discomfort. These are often done along with range of motion exercises. These exercises can help prevent or improve:  Difficulty opening your mouth.  Pain in your jaw while it is open or closed.  Temporomandibular joint (TMJ) pain.  Headache caused by jaw tension. Take other actions to prevent or relieve jaw pain, such as:  Avoiding things that cause or increase jaw pain. This may include: ? Chewing gum or eating hard foods. ? Clenching your jaw or teeth, grinding your teeth, or keeping tension in your jaw muscles. ? Opening your mouth wide, such as for a big yawn. ? Leaning on your jaw, such as resting your jaw in your hand while leaning on a desk.  Putting ice on your jaw. ? Put ice in a plastic bag. ? Place a towel between your skin and the bag. ? Leave the ice on for 10-15 minutes, 2-3 times a day. Only do jaw exercises that your health care provider approves of. Only move your jaw as far as it can comfortably go in each direction. Do not move your jaw into positions that cause pain. Range of motion exercises Repeat each of these exercises 8 times, 1-2 times a day, or as told by your health care provider. Exercise A: Forward protrusion 1. Push your jaw forward. Hold this position for 1-2 seconds. 2. Allow your jaw to return to its normal position and rest it there for 1-2 seconds. Exercise B: Controlled opening 1. Stand or sit in front of a mirror. Place your tongue on the roof of your mouth, just behind your top teeth. 2. Keeping your tongue on the roof of your mouth, slowly open and close your mouth. 3. While you open and close your  mouth, watch your jaw in the mirror. Try to keep your jaw from moving to one side or the other. Exercise C: Right and left motion 1. Move your jaw right. Hold this position for 1-2 seconds. Allow your jaw to return to its normal position, and rest it there for 1-2 seconds. 2. Move your jaw left. Hold this position for 1-2 seconds. Allow your jaw to return to its normal position, and rest it there for 1-2 seconds. Postural exercises Exercise A: Chin tucks 1. You can do this exercise sitting, standing, or lying down. 2. Move your head straight back, keeping your head level. You can guide the movement by placing your fingers on your chin to push your jaw back in an even motion. You should be able to feel a double chin form at the end of the motion. 3. Hold this position for 5 seconds. Repeat 10-15 times. Exercise B: Shoulder blade squeeze 1. Sit or stand. 2. Bend your elbows to about 90 degrees, which is the shape of a capital letter "L." Keep your upper arms by your body. 3. Squeeze your shoulder blades down and back, as though you were trying to touch your elbows behind you. Do not shrug your shoulders or move your head. 4. Hold this position for 5 seconds. Repeat 10-15 times. Exercise C: Chest stretch 1. Stand facing a corner. 2. Put both of your hands  and your forearms on the wall, with your arms wide apart. 3. Make sure your arms are at a 90-degree angle to your body. This means that you should hold your arms straight out from your body, level with the floor. 4. Step in toward the corner. Do not lean in. 5. Hold this position for 30 seconds. Repeat 3 times. Contact a health care provider if you have:  Jaw pain that is new or gets worse.  Clicking or popping sounds while doing the exercises. Get help right away if:  Your jaw is stuck in one place and you cannot move it.  You cannot open or close your mouth. This information is not intended to replace advice given to you by your health  care provider. Make sure you discuss any questions you have with your health care provider. Document Revised: 02/05/2019 Document Reviewed: 09/10/2017 Elsevier Patient Education  2020 [employer] Inc.  Carpal Tunnel Syndrome  Carpal tunnel syndrome is a condition that causes pain in your hand and arm. The carpal tunnel is a narrow area located on the palm side of your wrist. Repeated wrist motion or certain diseases may cause swelling within the tunnel. This swelling pinches the main nerve in the wrist (median nerve). What are the causes? This condition may be caused by:  Repeated wrist motions.  Wrist injuries.  Arthritis.  A cyst or tumor in the carpal tunnel.  Fluid buildup during pregnancy. Sometimes the cause of this condition is not known. What increases the risk? The following factors may make you more likely to develop this condition:  Having a job, such as being a Haematologist, that requires you to repeatedly move your wrist in the same motion.  Being a woman.  Having certain conditions, such as: ? Diabetes. ? Obesity. ? An underactive thyroid (hypothyroidism). ? Kidney failure. What are the signs or symptoms? Symptoms of this condition include:  A tingling feeling in your fingers, especially in your thumb, index, and middle fingers.  Tingling or numbness in your hand.  An aching feeling in your entire arm, especially when your wrist and elbow are bent for a long time.  Wrist pain that goes up your arm to your shoulder.  Pain that goes down into your palm or fingers.  A weak feeling in your hands. You may have trouble grabbing and holding items. Your symptoms may feel worse during the night. How is this diagnosed? This condition is diagnosed with a medical history and physical exam. You may also have tests, including:  Electromyogram (EMG). This test measures electrical signals sent by your nerves into the muscles.  Nerve conduction study. This test  measures how well electrical signals pass through your nerves.  Imaging tests, such as X-rays, ultrasound, and MRI. These tests check for possible causes of your condition. How is this treated? This condition may be treated with:  Lifestyle changes. It is important to stop or change the activity that caused your condition.  Doing exercise and activities to strengthen your muscles and bones (physical therapy).  Learning how to use your hand again after diagnosis (occupational therapy).  Medicines for pain and inflammation. This may include medicine that is injected into your wrist.  A wrist splint.  Surgery. Follow these instructions at home: If you have a splint:  Wear the splint as told by your health care provider. Remove it only as told by your health care provider.  Loosen the splint if your fingers tingle, become numb, or turn  cold and blue.  Keep the splint clean.  If the splint is not waterproof: ? Do not let it get wet. ? Cover it with a watertight covering when you take a bath or shower. Managing pain, stiffness, and swelling   If directed, put ice on the painful area: ? If you have a removable splint, remove it as told by your health care provider. ? Put ice in a plastic bag. ? Place a towel between your skin and the bag. ? Leave the ice on for 20 minutes, 2-3 times per day. General instructions  Take over-the-counter and prescription medicines only as told by your health care provider.  Rest your wrist from any activity that may be causing your pain. If your condition is work related, talk with your employer about changes that can be made, such as getting a wrist pad to use while typing.  Do any exercises as told by your health care provider, physical therapist, or occupational therapist.  Keep all follow-up visits as told by your health care provider. This is important. Contact a health care provider if:  You have new symptoms.  Your pain is not  controlled with medicines.  Your symptoms get worse. Get help right away if:  You have severe numbness or tingling in your wrist or hand. Summary  Carpal tunnel syndrome is a condition that causes pain in your hand and arm.  It is usually caused by repeated wrist motions.  Lifestyle changes and medicines are used to treat carpal tunnel syndrome. Surgery may be recommended.  Follow your health care provider's instructions about wearing a splint, resting from activity, keeping follow-up visits, and calling for help. This information is not intended to replace advice given to you by your health care provider. Make sure you discuss any questions you have with your health care provider. Document Revised: 02/20/2018 Document Reviewed: 02/20/2018 Elsevier Patient Education  2020 [employer].  Cholesterol Content in Foods Cholesterol is a waxy, fat-like substance that helps to carry fat in the blood. The body needs cholesterol in small amounts, but too much cholesterol can cause damage to the arteries and heart. Most people should eat less than 200 milligrams (mg) of cholesterol a day. Foods with cholesterol  Cholesterol is found in animal-based foods, such as meat, seafood, and dairy. Generally, low-fat dairy and lean meats have less cholesterol than full-fat dairy and fatty meats. The milligrams of cholesterol per serving (mg per serving) of common cholesterol-containing foods are listed below. Meat and other proteins  Egg -- one large whole egg has 186 mg.  Veal shank -- 4 oz has 141 mg.  Lean ground [place] (93% lean) -- 4 oz has 118 mg.  Fat-trimmed lamb loin -- 4 oz has 106 mg.  Lean ground beef (90% lean) -- 4 oz has 100 mg.  Lobster -- 3.5 oz has 90 mg.  Pork loin chops -- 4 oz has 86 mg.  Canned salmon -- 3.5 oz has 83 mg.  Fat-trimmed beef top loin -- 4 oz has 78 mg.  Frankfurter -- 1 frank (3.5 oz) has 77 mg.  Crab -- 3.5 oz has 71 mg.  Roasted chicken without  skin, white meat -- 4 oz has 66 mg.  Light bologna -- 2 oz has 45 mg.  Deli-cut [place] -- 2 oz has 31 mg.  Canned tuna -- 3.5 oz has 31 mg.  Tomasa Blase -- 1 oz has 29 mg.  Oysters and mussels (raw) -- 3.5 oz has 25 mg.  Mackerel -- 1 oz has 22 mg.  Trout -- 1 oz has 20 mg.  Pork sausage -- 1 link (1 oz) has 17 mg.  Salmon -- 1 oz has 16 mg.  Tilapia -- 1 oz has 14 mg. Dairy  Soft-serve ice cream --  cup (4 oz) has 103 mg.  Whole-milk yogurt -- 1 cup (8 oz) has 29 mg.  Cheddar cheese -- 1 oz has 28 mg.  American cheese -- 1 oz has 28 mg.  Whole milk -- 1 cup (8 oz) has 23 mg.  2% milk -- 1 cup (8 oz) has 18 mg.  Cream cheese -- 1 tablespoon (Tbsp) has 15 mg.  Cottage cheese --  cup (4 oz) has 14 mg.  Low-fat (1%) milk -- 1 cup (8 oz) has 10 mg.  Sour cream -- 1 Tbsp has 8.5 mg.  Low-fat yogurt -- 1 cup (8 oz) has 8 mg.  Nonfat Greek yogurt -- 1 cup (8 oz) has 7 mg.  Half-and-half cream -- 1 Tbsp has 5 mg. Fats and oils  Cod liver oil -- 1 tablespoon (Tbsp) has 82 mg.  Butter -- 1 Tbsp has 15 mg.  Lard -- 1 Tbsp has 14 mg.  Bacon grease -- 1 Tbsp has 14 mg.  Mayonnaise -- 1 Tbsp has 5-10 mg.  Margarine -- 1 Tbsp has 3-10 mg. Exact amounts of cholesterol in these foods may vary depending on specific ingredients and brands. Foods without cholesterol Most plant-based foods do not have cholesterol unless you combine them with a food that has cholesterol. Foods without cholesterol include:  Grains and cereals.  Vegetables.  Fruits.  Vegetable oils, such as olive, canola, and sunflower oil.  Legumes, such as peas, beans, and lentils.  Nuts and seeds.  Egg whites. Summary  The body needs cholesterol in small amounts, but too much cholesterol can cause damage to the arteries and heart.  Most people should eat less than 200 milligrams (mg) of cholesterol a day. This information is not intended to replace advice given to you by your health care  provider. Make sure you discuss any questions you have with your health care provider. Document Revised: 09/26/2017 Document Reviewed: 06/10/2017 Elsevier Patient Education  2020 Else[employer] Inc.  High Cholesterol  High cholesterol is a condition in which the blood has high levels of a white, waxy, fat-like substance (cholesterol). The human body needs small amounts of cholesterol. The liver makes all the cholesterol that the body needs. Extra (excess) cholesterol comes from the food that we eat. Cholesterol is carried from the liver by the blood through the blood vessels. If you have high cholesterol, deposits (plaques) may build up on the walls of your blood vessels (arteries). Plaques make the arteries narrower and stiffer. Cholesterol plaques increase your risk for heart attack and stroke. Work with your health care provider to keep your cholesterol levels in a healthy range. What increases the risk? This condition is more likely to develop in people who:  Eat foods that are high in animal fat (saturated fat) or cholesterol.  Are overweight.  Are not getting enough exercise.  Have a family history of high cholesterol. What are the signs or symptoms? There are no symptoms of this condition. How is this diagnosed? This condition may be diagnosed from the results of a blood test.  If you are older than age 22, your health care provider may check your cholesterol every 4-6 years.  You may be checked more often if you  already have high cholesterol or other risk factors for heart disease. The blood test for cholesterol measures:  "Bad" cholesterol (LDL cholesterol). This is the main type of cholesterol that causes heart disease. The desired level for LDL is less than 100.  "Good" cholesterol (HDL cholesterol). This type helps to protect against heart disease by cleaning the arteries and carrying the LDL away. The desired level for HDL is 60 or higher.  Triglycerides. These are fats that  the body can store or burn for energy. The desired number for triglycerides is lower than 150.  Total cholesterol. This is a measure of the total amount of cholesterol in your blood, including LDL cholesterol, HDL cholesterol, and triglycerides. A healthy number is less than 200. How is this treated? This condition is treated with diet changes, lifestyle changes, and medicines. Diet changes  This may include eating more whole grains, fruits, vegetables, nuts, and fish.  This may also include cutting back on red meat and foods that have a lot of added sugar. Lifestyle changes  Changes may include getting at least 40 minutes of aerobic exercise 3 times a week. Aerobic exercises include walking, biking, and swimming. Aerobic exercise along with a healthy diet can help you maintain a healthy weight.  Changes may also include quitting smoking. Medicines  Medicines are usually given if diet and lifestyle changes have failed to reduce your cholesterol to healthy levels.  Your health care provider may prescribe a statin medicine. Statin medicines have been shown to reduce cholesterol, which can reduce the risk of heart disease. Follow these instructions at home: Eating and drinking If told by your health care provider:  Eat chicken (without skin), fish, veal, shellfish, ground [place] breast, and round or loin cuts of red meat.  Do not eat fried foods or fatty meats, such as hot dogs and salami.  Eat plenty of fruits, such as apples.  Eat plenty of vegetables, such as broccoli, potatoes, and carrots.  Eat beans, peas, and lentils.  Eat grains such as barley, rice, couscous, and bulgur wheat.  Eat pasta without cream sauces.  Use skim or nonfat milk, and eat low-fat or nonfat yogurt and cheeses.  Do not eat or drink whole milk, cream, ice cream, egg yolks, or hard cheeses.  Do not eat stick margarine or tub margarines that contain trans fats (also called partially hydrogenated  oils).  Do not eat saturated tropical oils, such as coconut oil and palm oil.  Do not eat cakes, cookies, crackers, or other baked goods that contain trans fats.  General instructions  Exercise as directed by your health care provider. Increase your activity level with activities such as gardening, walking, and taking the stairs.  Take over-the-counter and prescription medicines only as told by your health care provider.  Do not use any products that contain nicotine or tobacco, such as cigarettes and e-cigarettes. If you need help quitting, ask your health care provider.  Keep all follow-up visits as told by your health care provider. This is important. Contact a health care provider if:  You are struggling to maintain a healthy diet or weight.  You need help to start on an exercise program.  You need help to stop smoking. Get help right away if:  You have chest pain.  You have trouble breathing. This information is not intended to replace advice given to you by your health care provider. Make sure you discuss any questions you have with your health care provider. Document Revised: 10/17/2017 Document  Reviewed: 04/13/2016 Elsevier Patient Education  2020 Elsevier Inc.  Cholesterol Content in Foods Cholesterol is a waxy, fat-like substance that helps to carry fat in the blood. The body needs cholesterol in small amounts, but too much cholesterol can cause damage to the arteries and heart. Most people should eat less than 200 milligrams (mg) of cholesterol a day. Foods with cholesterol  Cholesterol is found in animal-based foods, such as meat, seafood, and dairy. Generally, low-fat dairy and lean meats have less cholesterol than full-fat dairy and fatty meats. The milligrams of cholesterol per serving (mg per serving) of common cholesterol-containing foods are listed below. Meat and other proteins  Egg -- one large whole egg has 186 mg.  Veal shank -- 4 oz has 141 mg.  Lean  ground [place] (93% lean) -- 4 oz has 118 mg.  Fat-trimmed lamb loin -- 4 oz has 106 mg.  Lean ground beef (90% lean) -- 4 oz has 100 mg.  Lobster -- 3.5 oz has 90 mg.  Pork loin chops -- 4 oz has 86 mg.  Canned salmon -- 3.5 oz has 83 mg.  Fat-trimmed beef top loin -- 4 oz has 78 mg.  Frankfurter -- 1 frank (3.5 oz) has 77 mg.  Crab -- 3.5 oz has 71 mg.  Roasted chicken without skin, white meat -- 4 oz has 66 mg.  Light bologna -- 2 oz has 45 mg.  Deli-cut [place] -- 2 oz has 31 mg.  Canned tuna -- 3.5 oz has 31 mg.  Tomasa Blase -- 1 oz has 29 mg.  Oysters and mussels (raw) -- 3.5 oz has 25 mg.  Mackerel -- 1 oz has 22 mg.  Trout -- 1 oz has 20 mg.  Pork sausage -- 1 link (1 oz) has 17 mg.  Salmon -- 1 oz has 16 mg.  Tilapia -- 1 oz has 14 mg. Dairy  Soft-serve ice cream --  cup (4 oz) has 103 mg.  Whole-milk yogurt -- 1 cup (8 oz) has 29 mg.  Cheddar cheese -- 1 oz has 28 mg.  American cheese -- 1 oz has 28 mg.  Whole milk -- 1 cup (8 oz) has 23 mg.  2% milk -- 1 cup (8 oz) has 18 mg.  Cream cheese -- 1 tablespoon (Tbsp) has 15 mg.  Cottage cheese --  cup (4 oz) has 14 mg.  Low-fat (1%) milk -- 1 cup (8 oz) has 10 mg.  Sour cream -- 1 Tbsp has 8.5 mg.  Low-fat yogurt -- 1 cup (8 oz) has 8 mg.  Nonfat Greek yogurt -- 1 cup (8 oz) has 7 mg.  Half-and-half cream -- 1 Tbsp has 5 mg. Fats and oils  Cod liver oil -- 1 tablespoon (Tbsp) has 82 mg.  Butter -- 1 Tbsp has 15 mg.  Lard -- 1 Tbsp has 14 mg.  Bacon grease -- 1 Tbsp has 14 mg.  Mayonnaise -- 1 Tbsp has 5-10 mg.  Margarine -- 1 Tbsp has 3-10 mg. Exact amounts of cholesterol in these foods may vary depending on specific ingredients and brands. Foods without cholesterol Most plant-based foods do not have cholesterol unless you combine them with a food that has cholesterol. Foods without cholesterol include:  Grains and cereals.  Vegetables.  Fruits.  Vegetable oils, such as olive,  canola, and sunflower oil.  Legumes, such as peas, beans, and lentils.  Nuts and seeds.  Egg whites. Summary  The body needs cholesterol in small amounts, but too  much cholesterol can cause damage to the arteries and heart.  Most people should eat less than 200 milligrams (mg) of cholesterol a day. This information is not intended to replace advice given to you by your health care provider. Make sure you discuss any questions you have with your health care provider. Document Revised: 09/26/2017 Document Reviewed: 06/10/2017 Elsevier Patient Education  2020 [employer] Inc.   Vitamin D Deficiency Vitamin D deficiency is when your body does not have enough vitamin D. Vitamin D is important to your body because:  It helps your body use other minerals.  It helps to keep your bones strong and healthy.  It may help to prevent some diseases.  It helps your heart and other muscles work well. Not getting enough vitamin D can make your bones soft. It can also cause other health problems. What are the causes? This condition may be caused by:  Not eating enough foods that contain vitamin D.  Not getting enough sun.  Having diseases that make it hard for your body to absorb vitamin D.  Having a surgery in which a part of the stomach or a part of the small intestine is removed.  Having kidney disease or liver disease. What increases the risk? You are more likely to get this condition if:  You are older.  You do not spend much time outdoors.  You live in a nursing home.  You have had broken bones.  You have weak or thin bones (osteoporosis).  You have a disease or condition that changes how your body absorbs vitamin D.  You have dark skin.  You take certain medicines.  You are overweight or obese. What are the signs or symptoms?  In mild cases, there may not be any symptoms. If the condition is very bad, symptoms may include: ? Bone pain. ? Muscle pain. ? Falling  often. ? Broken bones caused by a minor injury. How is this treated? Treatment may include taking supplements as told by your doctor. Your doctor will tell you what dose is best for you. Supplements may include:  Vitamin D.  Calcium. Follow these instructions at home: Eating and drinking   Eat foods that contain vitamin D, such as: ? Dairy products, cereals, or juices with added vitamin D. Check the label. ? Fish, such as salmon or trout. ? Eggs. ? Oysters. ? Mushrooms. The items listed above may not be a complete list of what you can eat and drink. Contact a dietitian for more options. General instructions  Take medicines and supplements only as told by your doctor.  Get regular, safe exposure to natural sunlight.  Do not use a tanning bed.  Maintain a healthy weight. Lose weight if needed.  Keep all follow-up visits as told by your doctor. This is important. How is this prevented?  You can get vitamin D by: ? Eating foods that naturally contain vitamin D. ? Eating or drinking products that have vitamin D added to them, such as cereals, juices, and milk. ? Taking vitamin D or a multivitamin that contains vitamin D. ? Being in the sun. Your body makes vitamin D when your skin is exposed to sunlight. Your body changes the sunlight into a form of the vitamin that it can use. Contact a doctor if:  Your symptoms do not go away.  You feel sick to your stomach (nauseous).  You throw up (vomit).  You poop less often than normal, or you have trouble pooping (constipation). Summary  Vitamin  D deficiency is when your body does not have enough vitamin D.  Vitamin D helps to keep your bones strong and healthy.  This condition is often treated by taking a supplement.  Your doctor will tell you what dose is best for you. This information is not intended to replace advice given to you by your health care provider. Make sure you discuss any questions you have with your health  care provider. Document Revised: 06/22/2018 Document Reviewed: 06/22/2018 Elsevier Patient Education  2020 [employer] Inc.   COVID-19 Vaccine Information can be found at: [email or web address] For questions related to vaccine distribution or appointments, please email [email or web address] or call [phone].     Sinus Tachycardia  Sinus tachycardia is a kind of fast heartbeat. In sinus tachycardia, the heart beats more than 100 times a minute. Sinus tachycardia starts in a part of the heart called the sinus node. Sinus tachycardia may be harmless, or it may be a sign of a serious condition. What are the causes? This condition may be caused by:  Exercise or exertion.  A fever.  Pain.  Loss of body fluids (dehydration).  Severe bleeding (hemorrhage).  Anxiety and stress.  Certain substances, including: ? Alcohol. ? Caffeine. ? Tobacco and nicotine products. ? Cold medicines. ? Illegal drugs.  Medical conditions including: ? Heart disease. ? An infection. ? An overactive thyroid (hyperthyroidism). ? A lack of red blood cells (anemia). What are the signs or symptoms? Symptoms of this condition include:  A feeling that the heart is beating quickly (palpitations).  Suddenly noticing your heartbeat (cardiac awareness).  Dizziness.  Tiredness (fatigue).  Shortness of breath.  Chest pain.  Nausea.  Fainting. How is this diagnosed? This condition is diagnosed with:  A physical exam.  Other tests, such as: ? Blood tests. ? An electrocardiogram (ECG). This test measures the electrical activity of the heart. ? Ambulatory cardiac monitor. This records your heartbeats for 24 hours or more. You may be referred to a heart specialist (cardiologist). How is this treated? Treatment for this condition depends on the cause or the underlying condition. Treatment may involve:  Treating the underlying  condition.  Taking new medicines or changing your current medicines as told by your health care provider.  Making changes to your diet or lifestyle. Follow these instructions at home: Lifestyle   Do not use any products that contain nicotine or tobacco, such as cigarettes and e-cigarettes. If you need help quitting, ask your health care provider.  Do not use illegal drugs, such as cocaine.  Learn relaxation methods to help you when you get stressed or anxious. These include deep breathing.  Avoid caffeine or other stimulants. Alcohol use   Do not drink alcohol if: ? Your health care provider tells you not to drink. ? You are pregnant, may be pregnant, or are planning to become pregnant.  If you drink alcohol, limit how much you have: ? 0-1 drink a day for women. ? 0-2 drinks a day for men.  Be aware of how much alcohol is in your drink. In the [place] one drink equals one typical bottle of beer (12 oz), one-half glass of wine (5 oz), or one shot of hard liquor (1 oz). General instructions  Drink enough fluids to keep your urine pale yellow.  Take over-the-counter and prescription medicines only as told by your health care provider.  Keep all follow-up visits as told by your health care provider. This is important. Contact a health care provider if you have:  A fever.  Vomiting or diarrhea that does not go away. Get help right away if you:  Have pain in your chest, upper arms, jaw, or neck.  Become weak or dizzy.  Feel faint.  Have palpitations that do not go away. Summary  In sinus tachycardia, the heart beats more than 100 times a minute.  Sinus tachycardia may be harmless, or it may be a sign of a serious condition.  Treatment for this condition depends on the cause or the underlying condition.  Get help right away if you have pain in your chest, upper arms, jaw, or neck. This information is not intended to replace advice given to you by your health care  provider. Make sure you discuss any questions you have with your health care provider. Document Revised: 12/03/2017 Document Reviewed: 12/03/2017 Elsevier Patient Education  2020 [employer].

## 2019-12-24 NOTE — Progress Notes (Signed)
Patient is no longer having any ear pain.

## 2019-12-27 ENCOUNTER — Encounter: Payer: Self-pay | Admitting: Internal Medicine

## 2019-12-28 ENCOUNTER — Telehealth: Payer: Self-pay | Admitting: Internal Medicine

## 2019-12-28 NOTE — Telephone Encounter (Signed)
No record of flu shot in [place] since 2000.   Immunization record in Epic showing flu shot 11/25/2018.

## 2019-12-28 NOTE — Telephone Encounter (Signed)
-----   Message from Bevelyn Buckles, MD sent at 12/27/2019  1:09 PM EST ----- Check [facility]cir for flu shot 2020/21 year and log ThxTMs

## 2020-01-13 ENCOUNTER — Other Ambulatory Visit: Payer: Self-pay | Admitting: Internal Medicine

## 2020-01-13 ENCOUNTER — Telehealth: Payer: Self-pay | Admitting: Internal Medicine

## 2020-01-13 DIAGNOSIS — F419 Anxiety disorder, unspecified: Secondary | ICD-10-CM

## 2020-01-13 DIAGNOSIS — G43909 Migraine, unspecified, not intractable, without status migrainosus: Secondary | ICD-10-CM

## 2020-01-13 MED ORDER — BUSPIRONE HCL 15 MG PO TABS: ORAL_TABLET | ORAL | 5 refills | Status: DC

## 2020-01-13 MED ORDER — SUMATRIPTAN SUCCINATE 100 MG PO TABS: ORAL_TABLET | ORAL | 11 refills | Status: DC

## 2020-01-13 MED ORDER — NORTRIPTYLINE HCL 25 MG PO CAPS: 25.0000 mg | ORAL_CAPSULE | Freq: Every day | ORAL | 3 refills | Status: DC

## 2020-01-13 NOTE — Telephone Encounter (Signed)
[facility] with [facility] [place]. They have refills on file and will get these ready for the patient. Dr French Ana states she got a request on this patient for his meds to be switched to mail order.   Left message to inform patient of refills being ready and to ask about switching to mail order.

## 2020-01-13 NOTE — Telephone Encounter (Signed)
Patient called Access Nurse line last night. See below I have updated vaccine.  ---Caller states he is calling about his prescriptions (nortriptyline, buspirone, & sumatriptan). My chart says he has refills but [facility] says [facility] is out [facility] refills. He has no symptoms. States he has plenty of sumatriptan. Is out of nortriptyliine, has a few buspirone. Advised to call the office in the morning to request the refills. He also wanted to make [place] aware that he had his first Moderna COVID vaccine 01/12/2020

## 2020-03-13 ENCOUNTER — Encounter: Payer: Self-pay | Admitting: Internal Medicine

## 2020-05-19 ENCOUNTER — Ambulatory Visit: Payer: No Typology Code available for payment source | Admitting: Internal Medicine

## 2020-05-25 ENCOUNTER — Telehealth: Payer: Self-pay | Admitting: Internal Medicine

## 2020-05-25 ENCOUNTER — Encounter: Payer: No Typology Code available for payment source | Admitting: Internal Medicine

## 2020-05-25 NOTE — Telephone Encounter (Signed)
Patient no-showed today's appointment; appointment was for 05/25/20 at 1:30 pm, provider notified for review of record, patient agrees to reschedule missed appointment. Mychart sent to reschedule.

## 2020-05-26 ENCOUNTER — Ambulatory Visit (INDEPENDENT_AMBULATORY_CARE_PROVIDER_SITE_OTHER): Payer: No Typology Code available for payment source | Admitting: Internal Medicine

## 2020-05-26 ENCOUNTER — Other Ambulatory Visit: Payer: Self-pay

## 2020-05-26 ENCOUNTER — Encounter: Payer: Self-pay | Admitting: Internal Medicine

## 2020-05-26 VITALS — BP 130/86 | HR 97 | Temp 98.2°F | Ht 73.23 in | Wt 179.4 lb

## 2020-05-26 DIAGNOSIS — F419 Anxiety disorder, unspecified: Secondary | ICD-10-CM

## 2020-05-26 DIAGNOSIS — D751 Secondary polycythemia: Secondary | ICD-10-CM | POA: Diagnosis not present

## 2020-05-26 DIAGNOSIS — E782 Mixed hyperlipidemia: Secondary | ICD-10-CM

## 2020-05-26 DIAGNOSIS — E559 Vitamin D deficiency, unspecified: Secondary | ICD-10-CM | POA: Diagnosis not present

## 2020-05-26 DIAGNOSIS — F329 Major depressive disorder, single episode, unspecified: Secondary | ICD-10-CM

## 2020-05-26 DIAGNOSIS — Z0000 Encounter for general adult medical examination without abnormal findings: Secondary | ICD-10-CM | POA: Diagnosis not present

## 2020-05-26 DIAGNOSIS — E538 Deficiency of other specified B group vitamins: Secondary | ICD-10-CM | POA: Diagnosis not present

## 2020-05-26 LAB — CBC WITH DIFFERENTIAL/PLATELET
Basophils Absolute: 0 10*3/uL (ref 0.0–0.1)
Basophils Relative: 0.7 % (ref 0.0–3.0)
Eosinophils Absolute: 0.1 10*3/uL (ref 0.0–0.7)
Eosinophils Relative: 1.3 % (ref 0.0–5.0)
HCT: 44.1 % (ref 39.0–52.0)
Hemoglobin: 15.5 g/dL (ref 13.0–17.0)
Lymphocytes Relative: 41 % (ref 12.0–46.0)
Lymphs Abs: 2 10*3/uL (ref 0.7–4.0)
MCHC: 35.2 g/dL (ref 30.0–36.0)
MCV: 89.7 fl (ref 78.0–100.0)
Monocytes Absolute: 0.6 10*3/uL (ref 0.1–1.0)
Monocytes Relative: 11.6 % (ref 3.0–12.0)
Neutro Abs: 2.2 10*3/uL (ref 1.4–7.7)
Neutrophils Relative %: 45.4 % (ref 43.0–77.0)
Platelets: 163 10*3/uL (ref 150.0–400.0)
RBC: 4.91 Mil/uL (ref 4.22–5.81)
RDW: 12.4 % (ref 11.5–15.5)
WBC: 4.8 10*3/uL (ref 4.0–10.5)

## 2020-05-26 LAB — LDL CHOLESTEROL, DIRECT: Direct LDL: 77 mg/dL

## 2020-05-26 LAB — LIPID PANEL
Cholesterol: 134 mg/dL (ref 0–200)
HDL: 35.4 mg/dL — ABNORMAL LOW (ref >39.00)
NonHDL: 98.8
Total CHOL/HDL Ratio: 4
Triglycerides: 207 mg/dL — ABNORMAL HIGH (ref 0.0–149.0)
VLDL: 41.4 mg/dL — ABNORMAL HIGH (ref 0.0–40.0)

## 2020-05-26 LAB — VITAMIN B12: Vitamin B-12: 262 pg/mL (ref 211–911)

## 2020-05-26 LAB — VITAMIN D 25 HYDROXY (VIT D DEFICIENCY, FRACTURES): VITD: 63.05 ng/mL (ref 30.00–100.00)
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
## 2020-05-26 NOTE — Patient Instructions (Addendum)
Debrox ear wax drops  Panoxyl 4-10% for back and chest-beware can bleach clothes   [facility] therapist   [facility]-you make your own appt  Could be in person   Generalized Anxiety Disorder, Adult Generalized anxiety disorder (GAD) is a mental health disorder. People with this condition constantly worry about everyday events. Unlike normal anxiety, worry related to GAD is not triggered by a specific event. These worries also do not fade or get better with time. GAD interferes with life functions, including relationships, work, and school. GAD can vary from mild to severe. People with severe GAD can have intense waves of anxiety with physical symptoms (panic attacks). What are the causes? The exact cause of GAD is not known. What increases the risk? This condition is more likely to develop in:  Women.  People who have a family history of anxiety disorders.  People who are very shy.  People who experience very stressful life events, such as the death of a loved one.  People who have a very stressful family environment. What are the signs or symptoms? People with GAD often worry excessively about many things in their lives, such as their health and family. They may also be overly concerned about:  Doing well at work.  Being on time.  Natural disasters.  Friendships. Physical symptoms of GAD include:  Fatigue.  Muscle tension or having muscle twitches.  Trembling or feeling shaky.  Being easily startled.  Feeling like your heart is pounding or racing.  Feeling out of breath or like you cannot take a deep breath.  Having trouble falling asleep or staying asleep.  Sweating.  Nausea, diarrhea, or irritable bowel syndrome (IBS).  Headaches.  Trouble concentrating or remembering facts.  Restlessness.  Irritability. How is this diagnosed? Your health care provider can diagnose GAD based on your symptoms and medical history. You will also have a physical exam. The  health care provider will ask specific questions about your symptoms, including how severe they are, when they started, and if they come and go. Your health care provider may ask you about your use of alcohol or drugs, including prescription medicines. Your health care provider may refer you to a mental health specialist for further evaluation. Your health care provider will do a thorough examination and may perform additional tests to rule out other possible causes of your symptoms. To be diagnosed with GAD, a person must have anxiety that:  Is out of his or her control.  Affects several different aspects of his or her life, such as work and relationships.  Causes distress that makes him or her unable to take part in normal activities.  Includes at least three physical symptoms of GAD, such as restlessness, fatigue, trouble concentrating, irritability, muscle tension, or sleep problems. Before your health care provider can confirm a diagnosis of GAD, these symptoms must be present more days than they are not, and they must last for six months or longer. How is this treated? The following therapies are usually used to treat GAD:  Medicine. Antidepressant medicine is usually prescribed for long-term daily control. Antianxiety medicines may be added in severe cases, especially when panic attacks occur.  Talk therapy (psychotherapy). Certain types of talk therapy can be helpful in treating GAD by providing support, education, and guidance. Options include: ? Cognitive behavioral therapy (CBT). People learn coping skills and techniques to ease their anxiety. They learn to identify unrealistic or negative thoughts and behaviors and to replace them with positive ones. ? Acceptance  and commitment therapy (ACT). This treatment teaches people how to be mindful as a way to cope with unwanted thoughts and feelings. ? Biofeedback. This process trains you to manage your body's response (physiological  response) through breathing techniques and relaxation methods. You will work with a therapist while machines are used to monitor your physical symptoms.  Stress management techniques. These include yoga, meditation, and exercise. A mental health specialist can help determine which treatment is best for you. Some people see improvement with one type of therapy. However, other people require a combination of therapies. Follow these instructions at home:  Take over-the-counter and prescription medicines only as told by your health care provider.  Try to maintain a normal routine.  Try to anticipate stressful situations and allow extra time to manage them.  Practice any stress management or self-calming techniques as taught by your health care provider.  Do not punish yourself for setbacks or for not making progress.  Try to recognize your accomplishments, even if they are small.  Keep all follow-up visits as told by your health care provider. This is important. Contact a health care provider if:  Your symptoms do not get better.  Your symptoms get worse.  You have signs of depression, such as: ? A persistently sad, cranky, or irritable mood. ? Loss of enjoyment in activities that used to bring you joy. ? Change in weight or eating. ? Changes in sleeping habits. ? Avoiding friends or family members. ? Loss of energy for normal tasks. ? Feelings of guilt or worthlessness. Get help right away if:  You have serious thoughts about hurting yourself or others. If you ever feel like you may hurt yourself or others, or have thoughts about taking your own life, get help right away. You can go to your nearest emergency department or call:  Your local emergency services (911 in the [place]  A suicide crisis helpline, such as the National Suicide Prevention Lifeline at 437-217-6979. This is open 24 hours a day. Summary  Generalized anxiety disorder (GAD) is a mental health disorder that  involves worry that is not triggered by a specific event.  People with GAD often worry excessively about many things in their lives, such as their health and family.  GAD may cause physical symptoms such as restlessness, trouble concentrating, sleep problems, frequent sweating, nausea, diarrhea, headaches, and trembling or muscle twitching.  A mental health specialist can help determine which treatment is best for you. Some people see improvement with one type of therapy. However, other people require a combination of therapies. This information is not intended to replace advice given to you by your health care provider. Make sure you discuss any questions you have with your health care provider. Document Revised: 09/26/2017 Document Reviewed: 09/03/2016 Elsevier Patient Education  2020 [employer]  High Cholesterol  High cholesterol is a condition in which the blood has high levels of a white, waxy, fat-like substance (cholesterol). The human body needs small amounts of cholesterol. The liver makes all the cholesterol that the body needs. Extra (excess) cholesterol comes from the food that we eat. Cholesterol is carried from the liver by the blood through the blood vessels. If you have high cholesterol, deposits (plaques) may build up on the walls of your blood vessels (arteries). Plaques make the arteries narrower and stiffer. Cholesterol plaques increase your risk for heart attack and stroke. Work with your health care provider to keep your cholesterol levels in a healthy range. What increases the risk? This  condition is more likely to develop in people who:  Eat foods that are high in animal fat (saturated fat) or cholesterol.  Are overweight.  Are not getting enough exercise.  Have a family history of high cholesterol. What are the signs or symptoms? There are no symptoms of this condition. How is this diagnosed? This condition may be diagnosed from the results of a blood  test.  If you are older than age 22, your health care provider may check your cholesterol every 4-6 years.  You may be checked more often if you already have high cholesterol or other risk factors for heart disease. The blood test for cholesterol measures:  "Bad" cholesterol (LDL cholesterol). This is the main type of cholesterol that causes heart disease. The desired level for LDL is less than 100.  "Good" cholesterol (HDL cholesterol). This type helps to protect against heart disease by cleaning the arteries and carrying the LDL away. The desired level for HDL is 60 or higher.  Triglycerides. These are fats that the body can store or burn for energy. The desired number for triglycerides is lower than 150.  Total cholesterol. This is a measure of the total amount of cholesterol in your blood, including LDL cholesterol, HDL cholesterol, and triglycerides. A healthy number is less than 200. How is this treated? This condition is treated with diet changes, lifestyle changes, and medicines. Diet changes  This may include eating more whole grains, fruits, vegetables, nuts, and fish.  This may also include cutting back on red meat and foods that have a lot of added sugar. Lifestyle changes  Changes may include getting at least 40 minutes of aerobic exercise 3 times a week. Aerobic exercises include walking, biking, and swimming. Aerobic exercise along with a healthy diet can help you maintain a healthy weight.  Changes may also include quitting smoking. Medicines  Medicines are usually given if diet and lifestyle changes have failed to reduce your cholesterol to healthy levels.  Your health care provider may prescribe a statin medicine. Statin medicines have been shown to reduce cholesterol, which can reduce the risk of heart disease. Follow these instructions at home: Eating and drinking If told by your health care provider:  Eat chicken (without skin), fish, veal, shellfish, ground  [place]key breast, and round or loin cuts of red meat.  Do not eat fried foods or fatty meats, such as hot dogs and salami.  Eat plenty of fruits, such as apples.  Eat plenty of vegetables, such as broccoli, potatoes, and carrots.  Eat beans, peas, and lentils.  Eat grains such as barley, rice, couscous, and bulgur wheat.  Eat pasta without cream sauces.  Use skim or nonfat milk, and eat low-fat or nonfat yogurt and cheeses.  Do not eat or drink whole milk, cream, ice cream, egg yolks, or hard cheeses.  Do not eat stick margarine or tub margarines that contain trans fats (also called partially hydrogenated oils).  Do not eat saturated tropical oils, such as coconut oil and palm oil.  Do not eat cakes, cookies, crackers, or other baked goods that contain trans fats.  General instructions  Exercise as directed by your health care provider. Increase your activity level with activities such as gardening, walking, and taking the stairs.  Take over-the-counter and prescription medicines only as told by your health care provider.  Do not use any products that contain nicotine or tobacco, such as cigarettes and e-cigarettes. If you need help quitting, ask your health care provider.  Keep all follow-up visits as told by your health care provider. This is important. Contact a health care provider if:  You are struggling to maintain a healthy diet or weight.  You need help to start on an exercise program.  You need help to stop smoking. Get help right away if:  You have chest pain.  You have trouble breathing. This information is not intended to replace advice given to you by your health care provider. Make sure you discuss any questions you have with your health care provider. Document Revised: 10/17/2017 Document Reviewed: 04/13/2016 Elsevier Patient Education  2020 [employer].  Cholesterol Content in Foods Cholesterol is a waxy, fat-like substance that helps to carry fat in  the blood. The body needs cholesterol in small amounts, but too much cholesterol can cause damage to the arteries and heart. Most people should eat less than 200 milligrams (mg) of cholesterol a day. Foods with cholesterol  Cholesterol is found in animal-based foods, such as meat, seafood, and dairy. Generally, low-fat dairy and lean meats have less cholesterol than full-fat dairy and fatty meats. The milligrams of cholesterol per serving (mg per serving) of common cholesterol-containing foods are listed below. Meat and other proteins  Egg -- one large whole egg has 186 mg.  Veal shank -- 4 oz has 141 mg.  Lean ground [place] (93% lean) -- 4 oz has 118 mg.  Fat-trimmed lamb loin -- 4 oz has 106 mg.  Lean ground beef (90% lean) -- 4 oz has 100 mg.  Lobster -- 3.5 oz has 90 mg.  Pork loin chops -- 4 oz has 86 mg.  Canned salmon -- 3.5 oz has 83 mg.  Fat-trimmed beef top loin -- 4 oz has 78 mg.  Frankfurter -- 1 frank (3.5 oz) has 77 mg.  Crab -- 3.5 oz has 71 mg.  Roasted chicken without skin, white meat -- 4 oz has 66 mg.  Light bologna -- 2 oz has 45 mg.  Deli-cut [place] -- 2 oz has 31 mg.  Canned tuna -- 3.5 oz has 31 mg.  Tomasa Blase -- 1 oz has 29 mg.  Oysters and mussels (raw) -- 3.5 oz has 25 mg.  Mackerel -- 1 oz has 22 mg.  Trout -- 1 oz has 20 mg.  Pork sausage -- 1 link (1 oz) has 17 mg.  Salmon -- 1 oz has 16 mg.  Tilapia -- 1 oz has 14 mg. Dairy  Soft-serve ice cream --  cup (4 oz) has 103 mg.  Whole-milk yogurt -- 1 cup (8 oz) has 29 mg.  Cheddar cheese -- 1 oz has 28 mg.  American cheese -- 1 oz has 28 mg.  Whole milk -- 1 cup (8 oz) has 23 mg.  2% milk -- 1 cup (8 oz) has 18 mg.  Cream cheese -- 1 tablespoon (Tbsp) has 15 mg.  Cottage cheese --  cup (4 oz) has 14 mg.  Low-fat (1%) milk -- 1 cup (8 oz) has 10 mg.  Sour cream -- 1 Tbsp has 8.5 mg.  Low-fat yogurt -- 1 cup (8 oz) has 8 mg.  Nonfat Greek yogurt -- 1 cup (8 oz) has 7  mg.  Half-and-half cream -- 1 Tbsp has 5 mg. Fats and oils  Cod liver oil -- 1 tablespoon (Tbsp) has 82 mg.  Butter -- 1 Tbsp has 15 mg.  Lard -- 1 Tbsp has 14 mg.  Bacon grease -- 1 Tbsp has 14 mg.  Mayonnaise --  1 Tbsp has 5-10 mg.  Margarine -- 1 Tbsp has 3-10 mg. Exact amounts of cholesterol in these foods may vary depending on specific ingredients and brands. Foods without cholesterol Most plant-based foods do not have cholesterol unless you combine them with a food that has cholesterol. Foods without cholesterol include:  Grains and cereals.  Vegetables.  Fruits.  Vegetable oils, such as olive, canola, and sunflower oil.  Legumes, such as peas, beans, and lentils.  Nuts and seeds.  Egg whites. Summary  The body needs cholesterol in small amounts, but too much cholesterol can cause damage to the arteries and heart.  Most people should eat less than 200 milligrams (mg) of cholesterol a day. This information is not intended to replace advice given to you by your health care provider. Make sure you discuss any questions you have with your health care provider. Document Revised: 09/26/2017 Document Reviewed: 06/10/2017 Elsevier Patient Education  2020 [employer].

## 2020-05-29 LAB — ERYTHROPOIETIN: Erythropoietin: 13 m[IU]/mL (ref 2.6–18.5)

## 2020-07-03 ENCOUNTER — Other Ambulatory Visit: Payer: Self-pay | Admitting: Internal Medicine

## 2020-07-03 DIAGNOSIS — G43909 Migraine, unspecified, not intractable, without status migrainosus: Secondary | ICD-10-CM

## 2020-07-03 MED ORDER — NORTRIPTYLINE HCL 25 MG PO CAPS: 25.0000 mg | ORAL_CAPSULE | Freq: Every day | ORAL | 3 refills | Status: DC

## 2020-10-02 ENCOUNTER — Other Ambulatory Visit: Payer: Self-pay | Admitting: Internal Medicine

## 2020-10-02 DIAGNOSIS — F419 Anxiety disorder, unspecified: Secondary | ICD-10-CM

## 2020-10-02 MED ORDER — BUSPIRONE HCL 15 MG PO TABS: ORAL_TABLET | ORAL | 3 refills | Status: DC

## 2020-11-28 ENCOUNTER — Other Ambulatory Visit: Payer: Self-pay

## 2020-11-28 ENCOUNTER — Ambulatory Visit (INDEPENDENT_AMBULATORY_CARE_PROVIDER_SITE_OTHER): Payer: No Typology Code available for payment source | Admitting: Internal Medicine

## 2020-11-28 ENCOUNTER — Encounter: Payer: Self-pay | Admitting: Internal Medicine

## 2020-11-28 VITALS — BP 130/88 | HR 108 | Temp 98.2°F | Ht 73.23 in | Wt 194.8 lb

## 2020-11-28 DIAGNOSIS — Z1329 Encounter for screening for other suspected endocrine disorder: Secondary | ICD-10-CM | POA: Diagnosis not present

## 2020-11-28 DIAGNOSIS — G43109 Migraine with aura, not intractable, without status migrainosus: Secondary | ICD-10-CM

## 2020-11-28 DIAGNOSIS — E559 Vitamin D deficiency, unspecified: Secondary | ICD-10-CM

## 2020-11-28 DIAGNOSIS — G43111 Migraine with aura, intractable, with status migrainosus: Secondary | ICD-10-CM

## 2020-11-28 DIAGNOSIS — D751 Secondary polycythemia: Secondary | ICD-10-CM

## 2020-11-28 DIAGNOSIS — Z0000 Encounter for general adult medical examination without abnormal findings: Secondary | ICD-10-CM

## 2020-11-28 DIAGNOSIS — Z23 Encounter for immunization: Secondary | ICD-10-CM | POA: Diagnosis not present

## 2020-11-28 DIAGNOSIS — Z1389 Encounter for screening for other disorder: Secondary | ICD-10-CM

## 2020-11-28 MED ORDER — RIZATRIPTAN BENZOATE 10 MG PO TABS: 10.0000 mg | ORAL_TABLET | ORAL | 11 refills | Status: DC | PRN

## 2020-11-28 MED ORDER — MECLIZINE HCL 25 MG PO TABS: 12.5000 mg | ORAL_TABLET | Freq: Two times a day (BID) | ORAL | 2 refills | Status: DC | PRN

## 2020-11-28 NOTE — Progress Notes (Signed)
Chief Complaint  Patient presents with  . Follow-up  . Immunizations   F/u  1. Anxiety controlled taking buspar 15 mg bid prn due to trouble waking up with this medicaiton  Consider therapy in the future  2. Weight gain ~20 lbs rec healthy diet and exercise  3. Migraines controlled having 1x per month nortriptyline 25 mg qhs taking he did have a monster drinking today imitrex 100 mg not taking due to helps with pain but causes dizziness and nausea at times migraines will last hrs to 1 day at times he takes excedrine and this helps    Review of Systems  Constitutional: Negative for weight loss.  HENT: Negative for hearing loss.   Eyes: Negative for blurred vision.  Respiratory: Negative for shortness of breath.   Cardiovascular: Negative for chest pain.  Gastrointestinal: Negative for abdominal pain and blood in stool.  Musculoskeletal: Negative for falls and joint pain.  Skin: Negative for rash.  Neurological: Negative for headaches.  Psychiatric/Behavioral: The patient is not nervous/anxious.    Past Medical History:  Diagnosis Date  . Anxiety   . Frequent headaches   . Polycythemia    Past Surgical History:  Procedure Laterality Date  . TONSILLECTOMY AND ADENOIDECTOMY  2008  . WISDOM TOOTH EXTRACTION     Family History  Problem Relation Age of Onset  . Alcohol abuse Paternal Aunt   . Diabetes Father   . Diabetes Maternal Grandmother   . Migraines Maternal Grandfather   . Stroke Maternal Grandfather   . Other Maternal Grandfather        carotid artery stenosis   . Cancer Paternal Grandfather    Social History   Socioeconomic History  . Marital status: Single    Spouse name: Not on file  . Number of children: Not on file  . Years of education: Not on file  . Highest education level: 12th grade  Occupational History  . 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 Smoking status: Never Smoker  . Smokeless tobacco: Never Used  Substance and  Sexual Activity  . Alcohol use: No    Alcohol/week: 0.0 standard drinks  . Drug use: No  . Sexual activity: Not on file  Other Topics Concern  . Not on file  Social History Narrative   Single, lives with GF in a 2 story home. Has 1 dog   Has an occasional coffee or soda. Drinks approximately 12oz of hot tea a day.   Lives with GF now fiance as of 10/27/20    Social Determinants of Health   Financial Resource Strain: Not on file  Food Insecurity: Not on file  Transportation Needs: Not on file  Physical Activity: Not on file  Stress: Not on file  Social Connections: Not on file  Intimate Partner Violence: Not on file   Current Meds  Medication Sig  . aspirin-acetaminophen-caffeine (EXCEDRIN MIGRAINE) 250-250-65 MG tablet Take by mouth every 6 (six) hours as needed for headache. Excedrin migraine  . busPIRone (BUSPAR) 15 MG tablet TAKE 1 TABLET (15 MG TOTAL) BY MOUTH 2 (TWO) TIMES DAILY.DISCONTINUE 10 MG DOSE (Patient taking differently: Take 15 mg by mouth daily as needed. TAKE 1 TABLET (15 MG TOTAL) BY MOUTH 2 (TWO) TIMES DAILY.DISCONTINUE 10 MG DOSE)  . ibuprofen (ADVIL,MOTRIN) 200 MG tablet Take 200 mg by mouth every 6 (six) hours as needed.  . meclizine (ANTIVERT) 25 MG tablet Take 0.5-1 tablets (12.5-25 mg total) by mouth 2 (  two) times daily as needed for dizziness.  . nortriptyline (PAMELOR) 25 MG capsule Take 1 capsule (25 mg total) by mouth at bedtime.  . rizatriptan (MAXALT) 10 MG tablet Take 1 tablet (10 mg total) by mouth as needed for migraine. May repeat in 2 hours if needed 1 dose. Max dose 20 mg daily  . [DISCONTINUED] SUMAtriptan (IMITREX) 100 MG tablet Take 1 tablet earliest onset of headache.  May repeat x1 in 2 hours if headache persists or recurs.  Do not exceed 2 tablets in 24 hours   No Known Allergies No results found for this or any previous visit (from the past 2160 hour(s)). Objective  Body mass index is 25.54 kg/m. Wt Readings from Last 3 Encounters:   11/28/20 194 lb 12.8 oz (88.4 kg)  05/26/20 179 lb 6.4 oz (81.4 kg)  12/24/19 174 lb 12.8 oz (79.3 kg)   Temp Readings from Last 3 Encounters:  11/28/20 98.2 F (36.8 C) (Oral)  05/26/20 98.2 F (36.8 C) (Oral)  12/24/19 98.2 F (36.8 C) (Temporal)   BP Readings from Last 3 Encounters:  11/28/20 130/88  05/26/20 (!) 130/86  12/24/19 116/80   Pulse Readings from Last 3 Encounters:  11/28/20 (!) 108  05/26/20 97  12/24/19 (!) 114    Physical Exam Vitals and nursing note reviewed.  Constitutional:      Appearance: Normal appearance. He is well-developed, well-groomed and overweight.  HENT:     Head: Normocephalic and atraumatic.  Eyes:     Conjunctiva/sclera: Conjunctivae normal.     Pupils: Pupils are equal, round, and reactive to light.  Cardiovascular:     Rate and Rhythm: Regular rhythm. Tachycardia present.     Heart sounds: Normal heart sounds.  Pulmonary:     Effort: Pulmonary effort is normal.     Breath sounds: Normal breath sounds.  Abdominal:     General: Abdomen is flat. Bowel sounds are normal.     Tenderness: There is no abdominal tenderness.  Skin:    General: Skin is warm and dry.  Neurological:     General: No focal deficit present.     Mental Status: He is alert and oriented to person, place, and time. Mental status is at baseline.     Gait: Gait normal.  Psychiatric:        Attention and Perception: Attention and perception normal.        Mood and Affect: Mood and affect normal.        Speech: Speech normal.        Behavior: Behavior normal. Behavior is cooperative.        Thought Content: Thought content normal.        Cognition and Memory: Cognition and memory normal.        Judgment: Judgment normal.     Assessment  Plan  Intractable migraine with aura with status migrainosus - Plan: meclizine (ANTIVERT) 25 MG tablet, rizatriptan (MAXALT) 10 MG tablet up to 20 mg prn stop imitrex does not like the way makes him feel Migraine with  vertigo - Plan: meclizine (ANTIVERT) 25 MG tablet, rizatriptan (MAXALT) 10 MG tablet  Anxiety controlled using buspar 15 mg bid prn  Consider therapy in the future   HM Flu shottoday Tdaputd1/29/2020 3/3 moderna Hep B immune MMRimmune  HPV vaccines (had) STD neg  [facility] reviewed 11/29/19 given cholesterol handout  rec healthy diet and exercise  rec mvt with vitamin D3 for total in both  equal to 5000 IU daily D3   Provider: Dr. Olivia Mackie McLean-Scocuzza-[facility] Medicine

## 2020-11-28 NOTE — Patient Instructions (Addendum)
Consider meclizine    Rizatriptan tablets What is this medicine? RIZATRIPTAN (rye za TRIP tan) is used to treat migraines with or without aura. An aura is a strange feeling or visual disturbance that warns you of an attack. It is not used to prevent migraines. This medicine may be used for other purposes; ask your health care provider or pharmacist if you have questions. COMMON BRAND NAME(S): Maxalt What should I tell my health care provider before I take this medicine? They need to know if you have any of these conditions:  cigarette smoker  circulation problems in fingers and toes  diabetes  heart disease  high blood pressure  high cholesterol  history of irregular heartbeat  history of stroke  kidney disease  liver disease  stomach or intestine problems  an unusual or allergic reaction to rizatriptan, other medicines, foods, dyes, or preservatives  pregnant or trying to get pregnant  breast-feeding How should I use this medicine? Take this medicine by mouth with a glass of water. Follow the directions on the prescription label. Do not take it more often than directed. Talk to your pediatrician regarding the use of this medicine in children. While this drug may be prescribed for children as young as 6 years for selected conditions, precautions do apply. Overdosage: If you think you have taken too much of this medicine contact a poison control center or emergency room at once. NOTE: This medicine is only for you. Do not share this medicine with others. What if I miss a dose? This does not apply. This medicine is not for regular use. What may interact with this medicine? Do not take this medicine with any of the following medicines:  certain medicines for migraine headache like almotriptan, eletriptan, frovatriptan, naratriptan, rizatriptan, sumatriptan, zolmitriptan  ergot alkaloids like dihydroergotamine, ergonovine, ergotamine, methylergonovine  MAOIs like  Carbex, Eldepryl, Marplan, Nardil, and Parnate This medicine may also interact with the following medications:  certain medicines for depression, anxiety, or psychotic disorders  propranolol This list may not describe all possible interactions. Give your health care provider a list of all the medicines, herbs, non-prescription drugs, or dietary supplements you use. Also tell them if you smoke, drink alcohol, or use illegal drugs. Some items may interact with your medicine. What should I watch for while using this medicine? Visit your healthcare professional for regular checks on your progress. Tell your healthcare professional if your symptoms do not start to get better or if they get worse. You may get drowsy or dizzy. Do not drive, use machinery, or do anything that needs mental alertness until you know how this medicine affects you. Do not stand up or sit up quickly, especially if you are an older patient. This reduces the risk of dizzy or fainting spells. Alcohol may interfere with the effect of this medicine. Your mouth may get dry. Chewing sugarless gum or sucking hard candy and drinking plenty of water may help. Contact your healthcare professional if the problem does not go away or is severe. If you take migraine medicines for 10 or more days a month, your migraines may get worse. Keep a diary of headache days and medicine use. Contact your healthcare professional if your migraine attacks occur more frequently. What side effects may I notice from receiving this medicine? Side effects that you should report to your doctor or health care professional as soon as possible:  allergic reactions like skin rash, itching or hives, swelling of the face, lips, or tongue  chest  pain or chest tightness  signs and symptoms of a dangerous change in heartbeat or heart rhythm like chest pain; dizziness; fast, irregular heartbeat; palpitations; feeling faint or lightheaded; falls; breathing  problems  signs and symptoms of a stroke like changes in vision; confusion; trouble speaking or understanding; severe headaches; sudden numbness or weakness of the face, arm or leg; trouble walking; dizziness; loss of balance or coordination  signs and symptoms of serotonin syndrome like irritable; confusion; diarrhea; fast or irregular heartbeat; muscle twitching; stiff muscles; trouble walking; sweating; high fever; seizures; chills; vomiting Side effects that usually do not require medical attention (report to your doctor or health care professional if they continue or are bothersome):  diarrhea  dizziness  drowsiness  dry mouth  headache  nausea, vomiting  pain, tingling, numbness in the hands or feet  stomach pain This list may not describe all possible side effects. Call your doctor for medical advice about side effects. You may report side effects to FDA at 1-800-FDA-1088. Where should I keep my medicine? Keep out of the reach of children. Store at room temperature between 15 and 30 degrees C (59 and 86 degrees F). Keep container tightly closed. Throw away any unused medicine after the expiration date. NOTE: This sheet is a summary. It may not cover all possible information. If you have questions about this medicine, talk to your doctor, pharmacist, or health care provider.  2021 Elsevier/Gold Standard (2018-04-28 14:59:59)  tablets or capsules What is this medicine? MECLIZINE (MEK li zeen) is an antihistamine. It is used to prevent nausea, vomiting, or dizziness caused by motion sickness. It is also used to prevent and treat vertigo (extreme dizziness or a feeling that you or your surroundings are tilting or spinning around). This medicine may be used for other purposes; ask your health care provider or pharmacist if you have questions. COMMON BRAND NAME(S): Antivert, Dramamine Less Drowsy, Dramamine-N, Medivert, Meni-D What should I tell my health care provider before I  take this medicine? They need to know if you have any of these conditions:  glaucoma  lung or breathing disease, like asthma  problems urinating  prostate disease  stomach or intestine problems  an unusual or allergic reaction to meclizine, other medicines, foods, dyes, or preservatives  pregnant or trying to get pregnant  breast-feeding How should I use this medicine? Take this medicine by mouth with a glass of water. Follow the directions on the prescription label. If you are using this medicine to prevent motion sickness, take the dose at least 1 hour before travel. If it upsets your stomach, take it with food or milk. Take your doses at regular intervals. Do not take your medicine more often than directed. Talk to your pediatrician regarding the use of this medicine in children. Special care may be needed. Overdosage: If you think you have taken too much of this medicine contact a poison control center or emergency room at once. NOTE: This medicine is only for you. Do not share this medicine with others. What if I miss a dose? If you miss a dose, take it as soon as you can. If it is almost time for your next dose, take only that dose. Do not take double or extra doses. What may interact with this medicine? Do not take this medicine with any of the following medications:  MAOIs like Carbex, Eldepryl, Marplan, Nardil, and Parnate This medicine may also interact with the following medications:  alcohol  antihistamines for allergy, cough and cold  certain medicines for anxiety or sleep  certain medicines for depression, like amitriptyline, fluoxetine, sertraline  certain medicines for seizures like phenobarbital, primidone  general anesthetics like halothane, isoflurane, methoxyflurane, propofol  local anesthetics like lidocaine, pramoxine, tetracaine  medicines that relax muscles for surgery  narcotic medicines for pain  phenothiazines like chlorpromazine,  mesoridazine, prochlorperazine, thioridazine This list may not describe all possible interactions. Give your health care provider a list of all the medicines, herbs, non-prescription drugs, or dietary supplements you use. Also tell them if you smoke, drink alcohol, or use illegal drugs. Some items may interact with your medicine. What should I watch for while using this medicine? Tell your doctor or healthcare professional if your symptoms do not start to get better or if they get worse. You may get drowsy or dizzy. Do not drive, use machinery, or do anything that needs mental alertness until you know how this medicine affects you. Do not stand or sit up quickly, especially if you are an older patient. This reduces the risk of dizzy or fainting spells. Alcohol may interfere with the effect of this medicine. Avoid alcoholic drinks. Your mouth may get dry. Chewing sugarless gum or sucking hard candy, and drinking plenty of water may help. Contact your doctor if the problem does not go away or is severe. This medicine may cause dry eyes and blurred vision. If you wear contact lenses you may feel some discomfort. Lubricating drops may help. See your eye doctor if the problem does not go away or is severe. What side effects may I notice from receiving this medicine? Side effects that you should report to your doctor or health care professional as soon as possible:  feeling faint or lightheaded, falls  fast, irregular heartbeat Side effects that usually do not require medical attention (report to your doctor or health care professional if they continue or are bothersome):  constipation  headache  trouble passing urine or change in the amount of urine  trouble sleeping  upset stomach This list may not describe all possible side effects. Call your doctor for medical advice about side effects. You may report side effects to FDA at 1-800-FDA-1088. Where should I keep my medicine? Keep out of the  reach of children. Store at room temperature between 15 and 30 degrees C (59 and 86 degrees F). Keep container tightly closed. Throw away any unused medicine after the expiration date. NOTE: This sheet is a summary. It may not cover all possible information. If you have questions about this medicine, talk to your doctor, pharmacist, or health care provider.  2021 Elsevier/Gold Standard (2015-11-15 19:41:02)

## 2021-01-11 ENCOUNTER — Encounter: Payer: Self-pay | Admitting: Family Medicine

## 2021-01-11 ENCOUNTER — Telehealth: Payer: No Typology Code available for payment source | Admitting: Family Medicine

## 2021-01-11 ENCOUNTER — Telehealth: Payer: No Typology Code available for payment source | Admitting: Emergency Medicine

## 2021-01-11 DIAGNOSIS — J3089 Other allergic rhinitis: Secondary | ICD-10-CM

## 2021-01-11 DIAGNOSIS — G43009 Migraine without aura, not intractable, without status migrainosus: Secondary | ICD-10-CM | POA: Diagnosis not present

## 2021-01-11 DIAGNOSIS — R059 Cough, unspecified: Secondary | ICD-10-CM | POA: Insufficient documentation

## 2021-01-11 DIAGNOSIS — R6889 Other general symptoms and signs: Secondary | ICD-10-CM

## 2021-01-11 DIAGNOSIS — R0981 Nasal congestion: Secondary | ICD-10-CM | POA: Diagnosis not present

## 2021-01-11 MED ORDER — FLUTICASONE PROPIONATE 50 MCG/ACT NA SUSP: 2.0000 | Freq: Every day | NASAL | 0 refills | Status: DC

## 2021-01-11 MED ORDER — PROMETHAZINE-DM 6.25-15 MG/5ML PO SYRP: 2.5000 mL | ORAL_SOLUTION | Freq: Two times a day (BID) | ORAL | 0 refills | Status: DC | PRN

## 2021-01-11 MED ORDER — LORATADINE 10 MG PO TABS: 10.0000 mg | ORAL_TABLET | Freq: Every day | ORAL | 0 refills | Status: DC

## 2021-01-11 NOTE — Patient Instructions (Signed)
Allergies, Adult An allergy means that your body reacts to something that bothers it (allergen). This can happen from something that you eat, breathe in, or touch. Allergies often affect the nose, eyes, skin, and stomach. They can be mild, moderate, or very bad (severe). An allergy cannot spread from person to person. They can happen at any age. Sometimes, people outgrow them. What are the causes?  Outdoor things, such as pollen, car fumes, and mold.  Indoor things, such as dust, smoke, mold, and pets.  Foods.  Medicines.  Things that bother your skin, such as perfume and bug bites. What increases the risk?  Having family members with allergies or asthma. What are the signs or symptoms? Symptoms depend on how bad your allergy is. Mild to moderate symptoms  Runny nose, stuffy nose, or sneezing.  Itchy mouth, ears, or throat.  A feeling of mucus dripping down the back of your throat.  Sore throat.  Eyes that are itchy, red, watery, or puffy.  A skin rash, or red, swollen areas of skin (hives).  Stomach cramps or bloating. Severe symptoms Very bad allergies to food, medicine, or bug bites may cause a very bad allergy reaction (anaphylaxis). This can be life-threatening. Symptoms include:  A red face.  Wheezing or coughing.  Swollen lips, tongue, or mouth.  Tight or swollen throat.  Chest pain or tightness, or a fast heartbeat.  Trouble breathing or shortness of breath.  Pain in your belly (abdomen), vomiting, or watery poop (diarrhea).  Feeling dizzy or fainting. How is this treated? Treatment for this condition depends on your symptoms. Treatment may include:  Cold, wet cloths for itching and swelling.  Eye drops, nose sprays, or skin creams.  Washing out your nose each day.  A humidifier.  Medicines.  A change to the foods you eat.  Being exposed again and again to tiny amounts of allergens. This helps your body get used to them. You might  have: ? Allergy shots. ? Very small amounts of allergen put under your tongue.  An emergency shot (auto-injector pen) if you have a very bad allergy reaction. ? This is a medicine with a needle. You can put it into your skin by yourself. ? Your doctor will teach you how to use it.      Follow these instructions at home: Medicines  Take or apply over-the-counter and prescription medicines only as told by your doctor.  If you are at risk for a very bad allergy reaction, keep an auto-injector pen with you all the time.   Eating and drinking  Follow instructions from your doctor about what to eat and drink.  Drink enough fluid to keep your pee (urine) pale yellow. General instructions  If you have ever had a very bad allergy reaction, wear a medical alert bracelet or necklace.  Stay away from things that you are allergic to.  Keep all follow-up visits as told by your doctor. This is important. Contact a doctor if:  Your symptoms do not get better with treatment. Get help right away if:  You have symptoms of a very bad allergy reaction. These include: ? A swollen mouth, tongue, or throat. ? Pain or tightness in your chest. ? Trouble breathing. ? Being short of breath. ? Dizziness. ? Fainting. ? Very bad pain in your belly. ? Vomiting. ? Watery poop. These symptoms may be an emergency. Do not wait to see if the symptoms will go away. Get medical help right away. Call your local  emergency services (911 in the [place] Do not drive yourself to the hospital. Summary  Take or apply over-the-counter and prescription medicines only as told by your doctor.  Stay away from things you are allergic to.  If you are at risk for a very bad allergy reaction, carry an auto-injector pen all the time.  Wear a medical alert bracelet or necklace.  Very bad allergy reactions can be life-threatening. Get help right away. This information is not intended to replace advice given to you by your  health care provider. Make sure you discuss any questions you have with your health care provider. Document Revised: 08/25/2019 Document Reviewed: 08/25/2019 Elsevier Patient Education  2021 Elsevier Inc.   Migraine Headache A migraine headache is a very strong throbbing pain on one side or both sides of your head. This type of headache can also cause other symptoms. It can last from 4 hours to 3 days. Talk with your doctor about what things may bring on (trigger) this condition. What are the causes? The exact cause of this condition is not known. This condition may be triggered or caused by:  Drinking alcohol.  Smoking.  Taking medicines, such as: ? Medicine used to treat chest pain (nitroglycerin). ? Birth control pills. ? Estrogen. ? Some blood pressure medicines.  Eating or drinking certain products.  Doing physical activity. Other things that may trigger a migraine headache include:  Having a menstrual period.  Pregnancy.  Hunger.  Stress.  Not getting enough sleep or getting too much sleep.  Weather changes.  Tiredness (fatigue). What increases the risk?  Being 39-33 years old.  Being male.  Having a family history of migraine headaches.  Being Caucasian.  Having depression or anxiety.  Being very overweight. What are the signs or symptoms?  A throbbing pain. This pain may: ? Happen in any area of the head, such as on one side or both sides. ? Make it hard to do daily activities. ? Get worse with physical activity. ? Get worse around bright lights or loud noises.  Other symptoms may include: ? Feeling sick to your stomach (nauseous). ? Vomiting. ? Dizziness. ? Being sensitive to bright lights, loud noises, or smells.  Before you get a migraine headache, you may get warning signs (an aura). An aura may include: ? Seeing flashing lights or having blind spots. ? Seeing bright spots, halos, or zigzag lines. ? Having tunnel vision or blurred  vision. ? Having numbness or a tingling feeling. ? Having trouble talking. ? Having weak muscles.  Some people have symptoms after a migraine headache (postdromal phase), such as: ? Tiredness. ? Trouble thinking (concentrating). How is this treated?  Taking medicines that: ? Relieve pain. ? Relieve the feeling of being sick to your stomach. ? Prevent migraine headaches.  Treatment may also include: ? Having acupuncture. ? Avoiding foods that bring on migraine headaches. ? Learning ways to control your body functions (biofeedback). ? Therapy to help you know and deal with negative thoughts (cognitive behavioral therapy). Follow these instructions at home: Medicines  Take over-the-counter and prescription medicines only as told by your doctor.  Ask your doctor if the medicine prescribed to you: ? Requires you to avoid driving or using heavy machinery. ? Can cause trouble pooping (constipation). You may need to take these steps to prevent or treat trouble pooping:  Drink enough fluid to keep your pee (urine) pale yellow.  Take over-the-counter or prescription medicines.  Eat foods that are high in fiber.  These include beans, whole grains, and fresh fruits and vegetables.  Limit foods that are high in fat and sugar. These include fried or sweet foods. Lifestyle  Do not drink alcohol.  Do not use any products that contain nicotine or tobacco, such as cigarettes, e-cigarettes, and chewing tobacco. If you need help quitting, ask your doctor.  Get at least 8 hours of sleep every night.  Limit and deal with stress. General instructions  Keep a journal to find out what may bring on your migraine headaches. For example, write down: ? What you eat and drink. ? How much sleep you get. ? Any change in what you eat or drink. ? Any change in your medicines.  If you have a migraine headache: ? Avoid things that make your symptoms worse, such as bright lights. ? It may help to  lie down in a dark, quiet room. ? Do not drive or use heavy machinery. ? Ask your doctor what activities are safe for you.  Keep all follow-up visits as told by your doctor. This is important.      Contact a doctor if:  You get a migraine headache that is different or worse than others you have had.  You have more than 15 headache days in one month. Get help right away if:  Your migraine headache gets very bad.  Your migraine headache lasts longer than 72 hours.  You have a fever.  You have a stiff neck.  You have trouble seeing.  Your muscles feel weak or like you cannot control them.  You start to lose your balance a lot.  You start to have trouble walking.  You pass out (faint).  You have a seizure. Summary  A migraine headache is a very strong throbbing pain on one side or both sides of your head. These headaches can also cause other symptoms.  This condition may be treated with medicines and changes to your lifestyle.  Keep a journal to find out what may bring on your migraine headaches.  Contact a doctor if you get a migraine headache that is different or worse than others you have had.  Contact your doctor if you have more than 15 headache days in a month. This information is not intended to replace advice given to you by your health care provider. Make sure you discuss any questions you have with your health care provider. Document Revised: 02/05/2019 Document Reviewed: 11/26/2018 Elsevier Patient Education  2021 [employer].

## 2021-01-11 NOTE — Progress Notes (Signed)
Hi Donald Lane,  Based on what you shared with me, including the extreme fatigue, fever, neck stiffness and bad headache with a history of migraines, I feel your condition warrants further evaluation and I recommend that you be seen for a face to face office visit.     NOTE: If you entered your credit card information for this eVisit, you will not be charged. You may see a "hold" on your card for the $35 but that hold will drop off and you will not have a charge processed.   If you are having a true medical emergency please call 911.      For an urgent face to face visit, [facility] has five urgent care centers for your convenience:     [facility] at [facility] Directions [phone] [address] [place], [place] [address] . 10 am - 6pm Monday - Friday    [facility]) Get Driving Directions [phone] [address] [place], [place] [address] . 10 am to 8 pm Monday-Friday . 12 pm to 8 pm [facility] at [facility] Get Driving Directions [phone] [address], [address] [place], [place] [address] . 8 am to 8 pm Monday-Friday . 9 am to 6 pm Saturday . 11 am to 6 pm Sunday     [facility] at [facility] Get Driving Directions  [phone] [address] [place], [place] [address] . 8 am to 8 pm Monday-Friday . 8 am to 4 pm [facility] at [facility] Directions [phone] [address] [place], [place] [address] . 12 pm to 6 pm Monday-Friday      Your e-visit answers were reviewed by a board certified advanced clinical practitioner to complete your personal care plan.  Thank you for using e-Visits.    Approximately 5 minutes was spent documenting and reviewing patient's chart.

## 2021-01-11 NOTE — Progress Notes (Signed)
Donald Lane are scheduled for a virtual visit with your provider today.    Just as we do with appointments in the office, we must obtain your consent to participate.  Your consent will be active for this visit and any virtual visit you may have with one of our providers in the next 365 days.    If you have a MyChart account, I can also send a copy of this consent to you electronically.  All virtual visits are billed to your insurance company just like a traditional visit in the office.  As this is a virtual visit, video technology does not allow for your provider to perform a traditional examination.  This may limit your provider's ability to fully assess your condition.  If your provider identifies any concerns that need to be evaluated in person or the need to arrange testing such as labs, EKG, etc, we will make arrangements to do so.    Although advances in technology are sophisticated, we cannot ensure that it will always work on either your end or our end.  If the connection with a video visit is poor, we may have to switch to a telephone visit.  With either a video or telephone visit, we are not always able to ensure that we have a secure connection.   I need to obtain your verbal consent now.   Are you willing to proceed with your visit today?   Donald Lane has provided verbal consent on 01/11/2021 for a virtual visit (video or telephone).   Donald Finner, NP 01/11/2021  2:24 PM   Date:  01/11/2021   ID:  Donald Lane, DOB 1996/07/31, MRN [id number]  Patient Location: Home Provider Location: Home Office   Participants: Patient and Provider for Visit and Wrap up  Method of visit: Video  Location of Patient: Home Location of Provider: Home Office Consent was obtain for visit over the video. Services rendered by provider: Visit was performed via video  A video enabled telemedicine application was used and I verified that I am speaking with the correct  person using two identifiers.  PCP:  McLean-Scocuzza, Pasty Spillers, MD   Chief Complaint:  Migraine and sinus symptoms   History of Present Illness:    Donald Lane is a 25 y.o. male with history as stated below. Presents video telehealth for an acute care visit secondary to migraine and sinus symptoms. Onset of symptoms was 3 and symptoms have been persistent and include: runny nose, congestion, cough- causing voice change, achy in his neck, pain when swallowing. Has history of migraines- has not picked up his Maxalt medication yet. Also reports allergy history- usually not very bad, but does report could be causing this. Vey fatigued Low grade fevers per him. No recent sick contracts. COVID vaccinated.   Denies having chills, shortness of breath, chest pain, ear pain, exposure to covid or other sick contacts. No other aggravating or relieving factors.  No other c/o.   Past Medical, Surgical, Social History, Allergies, and Medications have been Reviewed.  Past Medical History:  Diagnosis Date  . Anxiety   . Frequent headaches   . Polycythemia     No outpatient medications have been marked as taking for the 01/11/21 encounter (Video Visit) with Donald Finner, NP.     Allergies:   Patient has no known allergies.   ROS See HPI for history of present illness.  Physical Exam Constitutional:      Appearance: Normal appearance.  HENT:     Head: Normocephalic and atraumatic.     Right Ear: External ear normal.     Left Ear: External ear normal.     Nose: Nose normal.  Eyes:     Conjunctiva/sclera: Conjunctivae normal.  Pulmonary:     Comments: No shortness of breath or cough noted in conversation  Musculoskeletal:        General: Normal range of motion.     Cervical back: Normal range of motion.  Neurological:     Mental Status: He is alert and oriented to person, place, and time.  Psychiatric:        Mood and Affect: Mood normal.        Behavior: Behavior normal.         Thought Content: Thought content normal.        Judgment: Judgment normal.               A&P  1. Migraine without aura and without status migrainosus, not intractable -advised to get maxalt to see if he can abort the migraine he is having. -mild overlap with S&S for cluster like migraine- educated on this -could also be allergies triggering this. -Patient acknowledged agreement and understanding of the plan.    2. Sinus congestion -S&S consistent with virus cold or allergies given season -treatment scripts and OTC discussed  Patient acknowledged agreement and understanding of the plan.    - fluticasone (FLONASE) 50 MCG/ACT nasal spray; Place 2 sprays into both nostrils daily.  Dispense: 16 g; Refill: 0 - loratadine (CLARITIN) 10 MG tablet; Take 1 tablet (10 mg total) by mouth daily.  Dispense: 90 tablet; Refill: 0 - promethazine-dextromethorphan (PROMETHAZINE-DM) 6.25-15 MG/5ML syrup; Take 2.5 mLs by mouth 2 (two) times daily as needed for cough.  Dispense: 118 mL; Refill: 0  3. Cough -cough is bothersome-script provided -advised to treat his allergies which will prevent the drainage that causes the coughing Patient acknowledged agreement and understanding of the plan.    - promethazine-dextromethorphan (PROMETHAZINE-DM) 6.25-15 MG/5ML syrup; Take 2.5 mLs by mouth 2 (two) times daily as needed for cough.  Dispense: 118 mL; Refill: 0  4. Environmental and seasonal allergies -treatment and discussion of allergies reviewed Patient acknowledged agreement and understanding of the plan.    - fluticasone (FLONASE) 50 MCG/ACT nasal spray; Place 2 sprays into both nostrils daily.  Dispense: 16 g; Refill: 0 - loratadine (CLARITIN) 10 MG tablet; Take 1 tablet (10 mg total) by mouth daily.  Dispense: 90 tablet; Refill: 0    Time:   Today, I have spent 15 minutes with the patient with telehealth technology discussing the above problems, reviewing the chart, previous notes,  medications and orders.    Tests Ordered: No orders of the defined types were placed in this encounter.   Medication Changes: No orders of the defined types were placed in this encounter.    Disposition:  Follow up as needed Signed, Donald Finner, NP  01/11/2021 2:24 PM

## 2021-01-24 ENCOUNTER — Ambulatory Visit
Admission: RE | Admit: 2021-01-24 | Discharge: 2021-01-24 | Disposition: A | Discharge status: Home / Self Care | Payer: No Typology Code available for payment source | Source: Ambulatory Visit | Attending: Internal Medicine | Admitting: Internal Medicine

## 2021-01-24 ENCOUNTER — Telehealth (INDEPENDENT_AMBULATORY_CARE_PROVIDER_SITE_OTHER): Payer: No Typology Code available for payment source | Admitting: Internal Medicine

## 2021-01-24 ENCOUNTER — Other Ambulatory Visit: Payer: No Typology Code available for payment source

## 2021-01-24 ENCOUNTER — Other Ambulatory Visit
Admission: RE | Admit: 2021-01-24 | Discharge: 2021-01-24 | Disposition: A | Discharge status: Home / Self Care | Payer: No Typology Code available for payment source | Source: Ambulatory Visit | Attending: Internal Medicine | Admitting: Internal Medicine

## 2021-01-24 ENCOUNTER — Encounter: Payer: Self-pay | Admitting: Internal Medicine

## 2021-01-24 ENCOUNTER — Other Ambulatory Visit: Payer: Self-pay

## 2021-01-24 VITALS — HR 120 | Temp 98.7°F | Wt 182.0 lb

## 2021-01-24 DIAGNOSIS — R059 Cough, unspecified: Secondary | ICD-10-CM

## 2021-01-24 DIAGNOSIS — R0602 Shortness of breath: Secondary | ICD-10-CM

## 2021-01-24 DIAGNOSIS — R000 Tachycardia, unspecified: Secondary | ICD-10-CM | POA: Diagnosis not present

## 2021-01-24 DIAGNOSIS — R197 Diarrhea, unspecified: Secondary | ICD-10-CM

## 2021-01-24 DIAGNOSIS — H9201 Otalgia, right ear: Secondary | ICD-10-CM

## 2021-01-24 DIAGNOSIS — R509 Fever, unspecified: Secondary | ICD-10-CM

## 2021-01-24 DIAGNOSIS — R7989 Other specified abnormal findings of blood chemistry: Secondary | ICD-10-CM

## 2021-01-24 DIAGNOSIS — Z20822 Contact with and (suspected) exposure to covid-19: Secondary | ICD-10-CM

## 2021-01-24 DIAGNOSIS — R079 Chest pain, unspecified: Secondary | ICD-10-CM

## 2021-01-24 LAB — CBC WITH DIFFERENTIAL/PLATELET
Abs Immature Granulocytes: 0.02 10*3/uL (ref 0.00–0.07)
Basophils Absolute: 0 10*3/uL (ref 0.0–0.1)
Basophils Relative: 0 %
Eosinophils Absolute: 0 10*3/uL (ref 0.0–0.5)
Eosinophils Relative: 0 %
HCT: 45.1 % (ref 39.0–52.0)
Hemoglobin: 15.9 g/dL (ref 13.0–17.0)
Immature Granulocytes: 0 %
Lymphocytes Relative: 17 %
Lymphs Abs: 1.2 10*3/uL (ref 0.7–4.0)
MCH: 30.6 pg (ref 26.0–34.0)
MCHC: 35.3 g/dL (ref 30.0–36.0)
MCV: 86.9 fL (ref 80.0–100.0)
Monocytes Absolute: 1.4 10*3/uL — ABNORMAL HIGH (ref 0.1–1.0)
Monocytes Relative: 20 %
Neutro Abs: 4.4 10*3/uL (ref 1.7–7.7)
Neutrophils Relative %: 63 %
Platelets: 176 10*3/uL (ref 150–400)
RBC: 5.19 MIL/uL (ref 4.22–5.81)
RDW: 11.5 % (ref 11.5–15.5)
WBC: 7.1 10*3/uL (ref 4.0–10.5)
nRBC: 0 % (ref 0.0–0.2)

## 2021-01-24 LAB — COMPREHENSIVE METABOLIC PANEL
ALT: 27 U/L (ref 0–44)
AST: 22 U/L (ref 15–41)
Albumin: 4.5 g/dL (ref 3.5–5.0)
Alkaline Phosphatase: 65 U/L (ref 38–126)
Anion gap: 8 (ref 5–15)
BUN: 10 mg/dL (ref 6–20)
CO2: 25 mmol/L (ref 22–32)
Calcium: 9 mg/dL (ref 8.9–10.3)
Chloride: 100 mmol/L (ref 98–111)
Creatinine, Ser: 0.87 mg/dL (ref 0.61–1.24)
GFR, Estimated: >60 mL/min (ref >60)
Glucose, Bld: 111 mg/dL — ABNORMAL HIGH (ref 70–99)
Potassium: 3.7 mmol/L (ref 3.5–5.1)
Sodium: 133 mmol/L — ABNORMAL LOW (ref 135–145)
Total Bilirubin: 0.7 mg/dL (ref 0.3–1.2)
Total Protein: 7.7 g/dL (ref 6.5–8.1)

## 2021-01-24 LAB — D-DIMER, QUANTITATIVE: D-Dimer, Quant: 0.75 ug/mL-FEU — ABNORMAL HIGH (ref 0.00–0.50)

## 2021-01-24 MED ORDER — BISMUTH SUBSALICYLATE 262 MG/15ML PO SUSP
30.0000 mL | Freq: Three times a day (TID) | ORAL | 0 refills | Status: DC
Start: 2021-01-24 — End: 2021-06-05

## 2021-01-24 MED ORDER — ALBUTEROL SULFATE HFA 108 (90 BASE) MCG/ACT IN AERS: 1.0000 | INHALATION_SPRAY | Freq: Four times a day (QID) | RESPIRATORY_TRACT | 2 refills | Status: DC | PRN

## 2021-01-24 MED ORDER — AZITHROMYCIN 250 MG PO TABS: ORAL_TABLET | ORAL | 0 refills | Status: DC

## 2021-01-24 MED ORDER — DM-GUAIFENESIN ER 60-1200 MG PO TB12: 1.0000 | ORAL_TABLET | Freq: Two times a day (BID) | ORAL | 0 refills | Status: DC

## 2021-01-24 MED ORDER — DM-GUAIFENESIN ER 60-1200 MG PO TB12
1.0000 | ORAL_TABLET | Freq: Two times a day (BID) | ORAL | 0 refills | Status: DC
Start: 2021-01-24 — End: 2021-01-24

## 2021-01-24 NOTE — Progress Notes (Signed)
Virtual Visit via Video Note  I connected with Donald Lane  on 01/24/21 at  2:00 PM EDT by a video enabled telemedicine application and verified that I am speaking with the correct person using two identifiers.  Location patient: home, [place] Location provider:work or home office Persons participating in the virtual visit: patient, provider  I discussed the limitations of evaluation and management by telemedicine and the availability of in person appointments. The patient expressed understanding and agreed to proceed.   HPI:  Acute telemedicine visit for : Since 12/1820 had intermittent fever 01/23/21 was 103F and 01/22/21 was 102 with dry to productive with thin mucous cough, sob with exertion, yesterday he had loose stool/liquid around 6 am, 10 am and then tried to go at 12 pm and nothing out. He had a stool accident in the bed last night. He has some body aches and resolved nausea and vomiting x 1, chills+, sweating on 01/23/21 and increased HR currently 110-120 but has been as low as 70 and as high as 160. He is drinking gatorade tried delsym for cough and has flonase and claritin. O2 sat is 97-98%. Appetite is low. He lives with fiance and she is well.  He is having right ear pain as well. No recent Ab use, no sick pets, city water he has.   He does have seasonal allergies but no h/o asthma   No covid exposure he know and is wearing his mask   Denies wheezing, sob but +sob on exertion I.e stairs, denies dysuria  -COVID-19 vaccine status: 3/3  ROS: See pertinent positives and negatives per HPI.  Past Medical History:  Diagnosis Date  . Anxiety   . Frequent headaches   . Polycythemia     Past Surgical History:  Procedure Laterality Date  . TONSILLECTOMY AND ADENOIDECTOMY  2008  . WISDOM TOOTH EXTRACTION       Current Outpatient Medications:  .  albuterol (VENTOLIN HFA) 108 (90 Base) MCG/ACT inhaler, Inhale 1-2 puffs into the lungs every 6 (six) hours as needed for wheezing or  shortness of breath. cough, Disp: 18 g, Rfl: 2 .  azithromycin (ZITHROMAX) 250 MG tablet, 2 pills day 1 and pill day 2-5 with food, Disp: 6 tablet, Rfl: 0 .  bismuth subsalicylate (PEPTO BISMOL) 262 MG/15ML suspension, Take 30 mLs by mouth 4 (four) times daily -  before meals and at bedtime. diarrhea, Disp: 360 mL, Rfl: 0 .  busPIRone (BUSPAR) 15 MG tablet, TAKE 1 TABLET (15 MG TOTAL) BY MOUTH 2 (TWO) TIMES DAILY.DISCONTINUE 10 MG DOSE (Patient taking differently: Take 15 mg by mouth daily as needed. TAKE 1 TABLET (15 MG TOTAL) BY MOUTH 2 (TWO) TIMES DAILY.DISCONTINUE 10 MG DOSE), Disp: 180 tablet, Rfl: 3 .  fluticasone (FLONASE) 50 MCG/ACT nasal spray, Place 2 sprays into both nostrils daily., Disp: 16 g, Rfl: 0 .  ibuprofen (ADVIL,MOTRIN) 200 MG tablet, Take 200 mg by mouth every 6 (six) hours as needed., Disp: , Rfl:  .  loratadine (CLARITIN) 10 MG tablet, Take 1 tablet (10 mg total) by mouth daily., Disp: 90 tablet, Rfl: 0 .  meclizine (ANTIVERT) 25 MG tablet, Take 0.5-1 tablets (12.5-25 mg total) by mouth 2 (two) times daily as needed for dizziness., Disp: 60 tablet, Rfl: 2 .  nortriptyline (PAMELOR) 25 MG capsule, Take 1 capsule (25 mg total) by mouth at bedtime., Disp: 90 capsule, Rfl: 3 .  promethazine-dextromethorphan (PROMETHAZINE-DM) 6.25-15 MG/5ML syrup, Take 2.5 mLs by mouth 2 (two) times daily as needed  for cough., Disp: 118 mL, Rfl: 0 .  rizatriptan (MAXALT) 10 MG tablet, Take 1 tablet (10 mg total) by mouth as needed for migraine. May repeat in 2 hours if needed 1 dose. Max dose 20 mg daily, Disp: 10 tablet, Rfl: 11 .  aspirin-acetaminophen-caffeine (EXCEDRIN MIGRAINE) 250-250-65 MG tablet, Take by mouth every 6 (six) hours as needed for headache. Excedrin migraine (Patient not taking: Reported on 01/24/2021), Disp: , Rfl:  .  Dextromethorphan-Guaifenesin 60-1200 MG 12hr tablet, Take 1 tablet by mouth every 12 (twelve) hours., Disp: 30 tablet, Rfl: 0  EXAM:  VITALS per patient if  applicable:  GENERAL: alert, oriented, appears well and in no acute distress  HEENT: atraumatic, conjunttiva clear, no obvious abnormalities on inspection of external nose and ears  NECK: normal movements of the head and neck  LUNGS: on inspection no signs of respiratory distress, breathing rate appears normal, no obvious gross SOB, gasping or wheezing  CV: no obvious cyanosis  MS: moves all visible extremities without noticeable abnormality  PSYCH/NEURO: pleasant and cooperative, no obvious depression or anxiety, speech and thought processing grossly intact  ASSESSMENT AND PLAN:  Discussed the following assessment and plan:  Fever with ST, r/o infection r/o covid cxr r/o pneumonia - Plan: Comprehensive metabolic panel, CBC w/Diff, D-Dimer, Quantitative, DG Chest 2 View, Novel Coronavirus, NAA ([facility]) zpak until results back of cxr   Sinus tachycardia - Plan: Comprehensive metabolic panel, CBC w/Diff, D-Dimer, Quantitative, DG Chest 2 View Continue hydration with water/gatorade   Cough - Plan: Comprehensive metabolic panel, CBC w/Diff, D-Dimer, Quantitative, DG Chest 2 View, Novel Coronavirus, NAA ([facility]), albuterol (VENTOLIN HFA) 108 (90 Base) MCG/ACT inhaler, Dextromethorphan-Guaifenesin 60-1200 MG 12hr tablet Warm tea with hone and lemon Continue claritin/flonase  Monitor O2 has pulse ox 97-98% today  SOB (shortness of breath) on exertion - Plan: Comprehensive metabolic panel, CBC w/Diff, D-Dimer, Quantitative, DG Chest 2 View, albuterol (VENTOLIN HFA) 108 (90 Base) MCG/ACT inhaler Pending CXR if negative consider CTA chest d dimer mildly elevated no risk factors for PE/DVT  Exposure to COVID-19 virus - Plan: Novel Coronavirus, NAA ([facility])  Diarrhea, unspecified type - Plan: bismuth subsalicylate (PEPTO BISMOL) 262 MG/15ML suspension BRAT diet bland diet  Right ear pain ? Infection with fever- Plan: azithromycin (ZITHROMAX) 250 MG tablet  -we discussed possible  serious and likely etiologies, options for evaluation and workup, limitations of telemedicine visit vs in person visit, treatment, treatment risks and precautions.   I discussed the assessment and treatment plan with the patient. The patient was provided an opportunity to ask questions and all were answered. The patient agreed with the plan and demonstrated an understanding of the instructions.    Time spent 20 min Bevelyn Buckles, MD

## 2021-01-24 NOTE — Progress Notes (Signed)
Onset of symptoms a week ago, 3/18th. Having Fever (103), ear pain(right ear), Cough, loss of appetite, diarrhea, increased heart rate, body aches, chills.   No one around the Patient was sick. Negative home test.

## 2021-01-24 NOTE — Patient Instructions (Signed)
If your covid test comes back + There is no medication other than over the counter meds:  Mucinex dm green label for cough.  Vitamin C 1000 mg daily.  Vitamin D3 4000 Iu (units) daily.  Zinc 100 mg daily.  Quercetin 250-500 mg 2 times per day   Elderberry  Oil of oregano  cepacol or chloroseptic spray if sore throat and warm salt water gargles  Warm tea with honey and lemon  Hydration water, gatorade zero sugar  Try to eat though you dont feel like it bland diet Tylenol or Advil for fever or pain Nasal saline  Flonase  claritin ok     Monitor pulse oximeter, buy from [facility] if oxygen is less than 90 please go to the hospital.        Are you feeling really sick? Shortness of breath, cough, chest pain?, dizziness? Confusion   If so let me know  If worsening, go to hospital or [facility] Urgent care for further treatment   Diarrhea, Adult Diarrhea is frequent loose and watery bowel movements. Diarrhea can make you feel weak and cause you to become dehydrated. Dehydration can make you tired and thirsty, cause you to have a dry mouth, and decrease how often you urinate. Diarrhea typically lasts 2-3 days. However, it can last longer if it is a sign of something more serious. It is important to treat your diarrhea as told by your health care provider. Follow these instructions at home: Eating and drinking Follow these recommendations as told by your health care provider:  Take an oral rehydration solution (ORS). This is an over-the-counter medicine that helps return your body to its normal balance of nutrients and water. It is found at pharmacies and retail stores.  Drink plenty of fluids, such as water, ice chips, diluted fruit juice, and low-calorie sports drinks. You can drink milk also, if desired.  Avoid drinking fluids that contain a lot of sugar or caffeine, such as energy drinks, sports drinks, and soda.  Eat bland, easy-to-digest foods in small amounts as you are able.  These foods include bananas, applesauce, rice, lean meats, toast, and crackers.  Avoid alcohol.  Avoid spicy or fatty foods.      Medicines  Take over-the-counter and prescription medicines only as told by your health care provider.  If you were prescribed an antibiotic medicine, take it as told by your health care provider. Do not stop using the antibiotic even if you start to feel better. General instructions  Wash your hands often using soap and water. If soap and water are not available, use a hand sanitizer. Others in the household should wash their hands as well. Hands should be washed: ? After using the toilet or changing a diaper. ? Before preparing, cooking, or serving food. ? While caring for a sick person or while visiting someone in a hospital.  Drink enough fluid to keep your urine pale yellow.  Rest at home while you recover.  Watch your condition for any changes.  Take a warm bath to relieve any burning or pain from frequent diarrhea episodes.  Keep all follow-up visits as told by your health care provider. This is important.   Contact a health care provider if:  You have a fever.  Your diarrhea gets worse.  You have new symptoms.  You cannot keep fluids down.  You feel light-headed or dizzy.  You have a headache.  You have muscle cramps. Get help right away if:  You have chest  pain.  You feel extremely weak or you faint.  You have bloody or black stools or stools that look like tar.  You have severe pain, cramping, or bloating in your abdomen.  You have trouble breathing or you are breathing very quickly.  Your heart is beating very quickly.  Your skin feels cold and clammy.  You feel confused.  You have signs of dehydration, such as: ? Dark urine, very little urine, or no urine. ? Cracked lips. ? Dry mouth. ? Sunken eyes. ? Sleepiness. ? Weakness. Summary  Diarrhea is frequent loose and watery bowel movements. Diarrhea can make  you feel weak and cause you to become dehydrated.  Drink enough fluids to keep your urine pale yellow.  Make sure that you wash your hands after using the toilet. If soap and water are not available, use hand sanitizer.  Contact a health care provider if your diarrhea gets worse or you have new symptoms.  Get help right away if you have signs of dehydration. This information is not intended to replace advice given to you by your health care provider. Make sure you discuss any questions you have with your health care provider. Document Revised: 03/02/2019 Document Reviewed: 03/20/2018 Elsevier Patient Education  2021 [employer]vier Inc.  [place] Diet A bland diet consists of foods that are often soft and do not have a lot of fat, fiber, or extra seasonings. Foods without fat, fiber, or seasoning are easier for the body to digest. They are also less likely to irritate your mouth, throat, stomach, and other parts of your digestive system. A bland diet is sometimes called a BRAT diet. What is my plan? Your health care provider or food and nutrition specialist (dietitian) may recommend specific changes to your diet to prevent symptoms or to treat your symptoms. These changes may include:  Eating small meals often.  Cooking food until it is soft enough to chew easily.  Chewing your food well.  Drinking fluids slowly.  Not eating foods that are very spicy, sour, or fatty.  Not eating citrus fruits, such as oranges and grapefruit. What do I need to know about this diet?  Eat a variety of foods from the bland diet food list.  Do not follow a bland diet longer than needed.  Ask your health care provider whether you should take vitamins or supplements. What foods can I eat? Grains Hot cereals, such as cream of wheat. Rice. Bread, crackers, or tortillas made from refined white flour.   Vegetables Canned or cooked vegetables. Mashed or boiled potatoes. Fruits Bananas. Applesauce. Other types of  cooked or canned fruit with the skin and seeds removed, such as canned peaches or pears.   Meats and other proteins Scrambled eggs. Creamy peanut butter or other nut butters. Lean, well-cooked meats, such as chicken or fish. Tofu. Soups or broths.   Dairy Low-fat dairy products, such as milk, cottage cheese, or yogurt. Beverages Water. Herbal tea. Apple juice.   Fats and oils Mild salad dressings. Canola or olive oil. Sweets and desserts Pudding. Custard. Fruit gelatin. Ice cream. The items listed above may not be a complete list of recommended foods and beverages. Contact a dietitian for more options. What foods are not recommended? Grains Whole grain breads and cereals. Vegetables Raw vegetables. Fruits Raw fruits, especially citrus, berries, or dried fruits. Dairy Whole fat dairy foods. Beverages Caffeinated drinks. Alcohol. Seasonings and condiments Strongly flavored seasonings or condiments. Hot sauce. Salsa. Other foods Spicy foods. Fried foods. Sour foods,  such as pickled or fermented foods. Foods with high sugar content. Foods high in fiber. The items listed above may not be a complete list of foods and beverages to avoid. Contact a dietitian for more information. Summary  A bland diet consists of foods that are often soft and do not have a lot of fat, fiber, or extra seasonings.  Foods without fat, fiber, or seasoning are easier for the body to digest.  Check with your health care provider to see how long you should follow this diet plan. It is not meant to be followed for long periods. This information is not intended to replace advice given to you by your health care provider. Make sure you discuss any questions you have with your health care provider. Document Revised: 11/12/2017 Document Reviewed: 11/12/2017 Elsevier Patient Education  2021 [employer].

## 2021-01-25 ENCOUNTER — Other Ambulatory Visit: Payer: Self-pay | Admitting: Internal Medicine

## 2021-01-25 ENCOUNTER — Encounter: Payer: Self-pay | Admitting: Internal Medicine

## 2021-01-25 LAB — NOVEL CORONAVIRUS, NAA: SARS-CoV-2, NAA: NOT DETECTED

## 2021-01-25 LAB — SARS-COV-2, NAA 2 DAY TAT

## 2021-01-25 NOTE — Addendum Note (Signed)
Addended by: Quentin Ore on: 01/25/2021 02:46 PM   Modules accepted: Orders

## 2021-01-25 NOTE — Telephone Encounter (Signed)
-----   Message from Bevelyn Buckles, MD sent at 01/24/2021  4:52 PM EDT ----- D dimer elevated  I want to see what CXR shows and covid test may need to schedule CT scan of his chest  Sodium slightly low and sugar elevated he is not fasting  Monocytes elevated could be due to infection will check for covid and pneumonia have done this today

## 2021-01-25 NOTE — Telephone Encounter (Signed)
-----   Message from Tracy N McLean-Scocuzza, MD sent at 01/24/2021  4:52 PM EDT ----- D dimer elevated  I want to see what CXR shows and covid test may need to schedule CT scan of his chest  Sodium slightly low and sugar elevated he is not fasting  Monocytes elevated could be due to infection will check for covid and pneumonia have done this today 

## 2021-03-03 ENCOUNTER — Other Ambulatory Visit: Payer: Self-pay | Admitting: Internal Medicine

## 2021-03-03 DIAGNOSIS — G43111 Migraine with aura, intractable, with status migrainosus: Secondary | ICD-10-CM

## 2021-03-03 DIAGNOSIS — G43109 Migraine with aura, not intractable, without status migrainosus: Secondary | ICD-10-CM

## 2021-04-02 ENCOUNTER — Other Ambulatory Visit: Payer: Self-pay | Admitting: Internal Medicine

## 2021-04-02 DIAGNOSIS — G43109 Migraine with aura, not intractable, without status migrainosus: Secondary | ICD-10-CM

## 2021-04-02 DIAGNOSIS — G43111 Migraine with aura, intractable, with status migrainosus: Secondary | ICD-10-CM

## 2021-05-28 ENCOUNTER — Other Ambulatory Visit: Payer: No Typology Code available for payment source

## 2021-06-05 ENCOUNTER — Ambulatory Visit (INDEPENDENT_AMBULATORY_CARE_PROVIDER_SITE_OTHER): Payer: No Typology Code available for payment source | Admitting: Internal Medicine

## 2021-06-05 ENCOUNTER — Encounter: Payer: Self-pay | Admitting: Internal Medicine

## 2021-06-05 ENCOUNTER — Other Ambulatory Visit: Payer: Self-pay

## 2021-06-05 VITALS — BP 126/84 | HR 92 | Temp 97.1°F | Ht 74.0 in | Wt 192.4 lb

## 2021-06-05 DIAGNOSIS — G43109 Migraine with aura, not intractable, without status migrainosus: Secondary | ICD-10-CM

## 2021-06-05 DIAGNOSIS — Z0000 Encounter for general adult medical examination without abnormal findings: Secondary | ICD-10-CM | POA: Diagnosis not present

## 2021-06-05 DIAGNOSIS — F419 Anxiety disorder, unspecified: Secondary | ICD-10-CM | POA: Diagnosis not present

## 2021-06-05 DIAGNOSIS — G43111 Migraine with aura, intractable, with status migrainosus: Secondary | ICD-10-CM

## 2021-06-05 DIAGNOSIS — Z1389 Encounter for screening for other disorder: Secondary | ICD-10-CM

## 2021-06-05 DIAGNOSIS — Z1329 Encounter for screening for other suspected endocrine disorder: Secondary | ICD-10-CM

## 2021-06-05 DIAGNOSIS — G43909 Migraine, unspecified, not intractable, without status migrainosus: Secondary | ICD-10-CM

## 2021-06-05 DIAGNOSIS — T148XXA Other injury of unspecified body region, initial encounter: Secondary | ICD-10-CM

## 2021-06-05 DIAGNOSIS — E559 Vitamin D deficiency, unspecified: Secondary | ICD-10-CM

## 2021-06-05 DIAGNOSIS — E785 Hyperlipidemia, unspecified: Secondary | ICD-10-CM

## 2021-06-05 MED ORDER — NORTRIPTYLINE HCL 25 MG PO CAPS: 25.0000 mg | ORAL_CAPSULE | Freq: Every day | ORAL | 3 refills | Status: DC

## 2021-06-05 MED ORDER — MECLIZINE HCL 25 MG PO TABS: ORAL_TABLET | ORAL | 5 refills | Status: DC

## 2021-06-05 MED ORDER — HYDROXYZINE HCL 25 MG PO TABS: 25.0000 mg | ORAL_TABLET | Freq: Every day | ORAL | 3 refills | Status: DC | PRN

## 2021-06-05 MED ORDER — MUPIROCIN 2 % EX OINT: 1.0000 "application " | TOPICAL_OINTMENT | Freq: Two times a day (BID) | CUTANEOUS | 0 refills | Status: DC

## 2021-06-05 NOTE — Patient Instructions (Signed)
Hydroxyzine Capsules or Tablets What is this medication? HYDROXYZINE (hye DROX i zeen) treats the symptoms of allergies and allergic reactions. It may also be used to treat anxiety or cause drowsiness before a procedure. It works by blocking histamine, a substance released by the body during an allergic reaction. It belongs to a group of medications called antihistamines. This medicine may be used for other purposes; ask your health care provider or pharmacist if you have questions. COMMON BRAND NAME(S): ANX, Atarax, Rezine, Vistaril What should I tell my care team before I take this medication? They need to know if you have any of these conditions: Glaucoma Heart disease History of irregular heartbeat Kidney disease Liver disease Lung or breathing disease, like asthma Stomach or intestine problems Thyroid disease Trouble passing urine An unusual or allergic reaction to hydroxyzine, cetirizine, other medications, foods, dyes or preservatives Pregnant or trying to get pregnant Breast-feeding How should I use this medication? Take this medication by mouth with a full glass of water. Follow the directions on the prescription label. You may take this medication with food or on an empty stomach. Take your medication at regular intervals. Do not take your medication more often than directed. Talk to your care team regarding the use of this medication in children. Special care may be needed. While this medication may be prescribed for children as young as 6 years of age for selected conditions, precautions do apply. Patients over 65 years old may have a stronger reaction and need a smaller dose. Overdosage: If you think you have taken too much of this medicine contact a poison control center or emergency room at once. NOTE: This medicine is only for you. Do not share this medicine with others. What if I miss a dose? If you miss a dose, take it as soon as you can. If it is almost time for your next  dose, take only that dose. Do not take double or extra doses. What may interact with this medication? Do not take this medication with any of the following: Cisapride Dronedarone Pimozide Thioridazine This medication may also interact with the following: Alcohol Antihistamines for allergy, cough, and cold Atropine Barbiturate medications for sleep or seizures, like phenobarbital Certain antibiotics like erythromycin or clarithromycin Certain medications for anxiety or sleep Certain medications for bladder problems like oxybutynin, tolterodine Certain medications for depression or psychotic disturbances Certain medications for irregular heart beat Certain medications for Parkinson's disease like benztropine, trihexyphenidyl Certain medications for seizures like phenobarbital, primidone Certain medications for stomach problems like dicyclomine, hyoscyamine Certain medications for travel sickness like scopolamine Ipratropium Narcotic medications for pain Other medications that prolong the QT interval (which can cause an abnormal heart rhythm) like dofetilide This list may not describe all possible interactions. Give your health care provider a list of all the medicines, herbs, non-prescription drugs, or dietary supplements you use. Also tell them if you smoke, drink alcohol, or use illegal drugs. Some items may interact with your medicine. What should I watch for while using this medication? Tell your care team if your symptoms do not improve. You may get drowsy or dizzy. Do not drive, use machinery, or do anything that needs mental alertness until you know how this medication affects you. Do not stand or sit up quickly, especially if you are an older patient. This reduces the risk of dizzy or fainting spells. Alcohol may interfere with the effect of this medication. Avoid alcoholic drinks. Your mouth may get dry. Chewing sugarless gum or sucking hard   candy, and drinking plenty of water may  help. Contact your care team if the problem does not go away or is severe. This medication may cause dry eyes and blurred vision. If you wear contact lenses you may feel some discomfort. Lubricating drops may help. See your eye care specialist if the problem does not go away or is severe. If you are receiving skin tests for allergies, tell your care team you are using this medication. What side effects may I notice from receiving this medication? Side effects that you should report to your care team as soon as possible: Allergic reactions-skin rash, itching, hives, swelling of the face, lips, tongue, or throat Heart rhythm changes-fast or irregular heartbeat, dizziness, feeling faint or lightheaded, chest pain, trouble breathing Side effects that usually do not require medical attention (report to your care team if they continue or are bothersome): Confusion Drowsiness Dry mouth Hallucinations Headache This list may not describe all possible side effects. Call your doctor for medical advice about side effects. You may report side effects to FDA at 1-800-FDA-1088. Where should I keep my medication? Keep out of the reach of children and pets. Store at room temperature between 15 and 30 degrees C (59 and 86 degrees F). Keep container tightly closed. Throw away any unused medication after the expiration date. NOTE: This sheet is a summary. It may not cover all possible information. If you have questions about this medicine, talk to your doctor, pharmacist, or health care provider.  2022 Elsevier/Gold Standard (2020-12-26 15:19:25)  

## 2021-06-05 NOTE — Progress Notes (Signed)
Chief Complaint  Patient presents with   Annual Exam   Annual  1. Doing well anxiety reduced and not taking buspirone 15 mg bid he does have problems at times sleeping at night and wants something prn for anxiety disc and will try atarax  2.he will be getting married upcoming with fiance at [employer]  3. Migraines on nortriptyline 25 mg qhs and helping and maxalt 10 mg prn, excedrin prn he does get dizzy with migraines at times will refill meclizine 12.5 -25 mg prn and consider neurology in future f/u prn Dr. Tomi Likens established last seen 08/21/18  4. New puppy and scratches to b/l forearms   Review of Systems  Constitutional:  Negative for weight loss.  HENT:  Negative for hearing loss.   Eyes:  Negative for blurred vision.  Respiratory:  Negative for shortness of breath.   Cardiovascular:  Negative for chest pain.  Gastrointestinal:  Negative for abdominal pain.  Musculoskeletal:  Negative for back pain, falls and joint pain.  Skin:  Negative for rash.  Neurological:  Negative for headaches.  Psychiatric/Behavioral:  Negative for depression. The patient has insomnia. The patient is not nervous/anxious.   Past Medical History:  Diagnosis Date   Anxiety    Frequent headaches    Polycythemia    Past Surgical History:  Procedure Laterality Date   TONSILLECTOMY AND ADENOIDECTOMY  2008   WISDOM TOOTH EXTRACTION     Family History  Problem Relation Age of Onset   Kidney Stones Mother    Diabetes Father        prediabetes resolved   Diabetes Maternal Grandmother    Migraines Maternal Grandfather    Stroke Maternal Grandfather    Other Maternal Grandfather        carotid artery stenosis    Diabetes Paternal Grandfather        amputation   Cancer Paternal Grandfather    Alcohol abuse Paternal Aunt    Social History   Socioeconomic History   Marital status: Single    Spouse name: Not on file   Number of children: Not on file   Years of education: Not on file    Highest education level: 12th grade  Occupational History   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 Has 1 dog   Has an occasional coffee or soda. Drinks approximately 12oz of hot tea a day.   Lives with GF now fiance as of 10/27/20    Only child    Getting married summer 09/2021    Social Determinants of Health   Financial Resource Strain: Not on file  Food Insecurity: Not on file  Transportation Needs: Not on file  Physical Activity: Not on file  Stress: Not on file  Social Connections: Not on file  Intimate Partner Violence: Not on file   Current Meds  Medication Sig   aspirin-acetaminophen-caffeine (EXCEDRIN MIGRAINE) 250-250-65 MG tablet Take by mouth every 6 (six) hours as needed for headache. Excedrin migraine   hydrOXYzine (ATARAX/VISTARIL) 25 MG tablet Take 1 tablet (25 mg total) by mouth daily as needed. D/c buspar  ibuprofen (ADVIL,MOTRIN) 200 MG tablet Take 200 mg by mouth every 6 (six) hours as needed.   mupirocin ointment (BACTROBAN) 2 % Apply 1 application topically 2 (two) times daily. Prn arm abrasions   rizatriptan (MAXALT) 10 MG tablet Take 1 tablet (20m) by mouth as needed for migraine. May repeat 1 dose in 2 hours if needed. Max dose of 238mdaily.   [DISCONTINUED] busPIRone (BUSPAR) 15 MG tablet TAKE 1 TABLET (15 MG TOTAL) BY MOUTH 2 (TWO) TIMES DAILY.DISCONTINUE 10 MG DOSE (Patient taking differently: Take 15 mg by mouth daily as needed. TAKE 1 TABLET (15 MG TOTAL) BY MOUTH 2 (TWO) TIMES DAILY.DISCONTINUE 10 MG DOSE)   [DISCONTINUED] nortriptyline (PAMELOR) 25 MG capsule Take 1 capsule (25 mg total) by mouth at bedtime.   No Known Allergies No results  found for this or any previous visit (from the past 2160 hour(s)). Objective  Body mass index is 24.7 kg/m. Wt Readings from Last 3 Encounters:  06/05/21 192 lb 6.4 oz (87.3 kg)  01/24/21 182 lb (82.6 kg)  11/28/20 194 lb 12.8 oz (88.4 kg)   Temp Readings from Last 3 Encounters:  06/05/21 (!) 97.1 F (36.2 C) (Temporal)  01/24/21 98.7 F (37.1 C) (Tympanic)  11/28/20 98.2 F (36.8 C) (Oral)   BP Readings from Last 3 Encounters:  06/05/21 126/84  11/28/20 130/88  05/26/20 (!) 130/86   Pulse Readings from Last 3 Encounters:  06/05/21 92  01/24/21 (!) 120  11/28/20 (!) 108    Physical Exam Vitals and nursing note reviewed.  Constitutional:      Appearance: Normal appearance. He is well-developed and well-groomed.  HENT:     Head: Normocephalic and atraumatic.  Eyes:     Conjunctiva/sclera: Conjunctivae normal.     Pupils: Pupils are equal, round, and reactive to light.  Cardiovascular:     Rate and Rhythm: Normal rate and regular rhythm.     Heart sounds: Normal heart sounds. No murmur heard. Pulmonary:     Effort: Pulmonary effort is normal.     Breath sounds: Normal breath sounds.  Abdominal:     Tenderness: There is no abdominal tenderness.  Skin:    General: Skin is warm and dry.  Neurological:     General: No focal deficit present.     Mental Status: He is alert and oriented to person, place, and time. Mental status is at baseline.     Gait: Gait normal.  Psychiatric:        Attention and Perception: Attention and perception normal.        Mood and Affect: Mood and affect normal.        Speech: Speech normal.        Behavior: Behavior normal. Behavior is cooperative.        Thought Content: Thought content normal.        Cognition and Memory: Cognition and memory normal.        Judgment: Judgment normal.    Assessment  Plan  Annual physical exam - Plan: Comprehensive metabolic panel, Lipid panel, CBC with Differential/Platelet, TSH, Urinalysis, Routine  w reflex microscopic, Vitamin D (25 hydroxy) Flu shot due Tdap utd 11/25/2018  3/3 moderna not had covid 06/05/21 covid 19  Hep B immune  MMR immune    HPV vaccines (had) STD neg Dermatology [place] derm referred in the past [facility]  Labs reviewed 11/29/19 given cholesterol handout rec healthy diet and exercise rec mvt with vitamin D3 for total in both equal  to 5000 IU daily D3   Anxiety - Plan: hydrOXYzine (ATARAX/VISTARIL) 25 MG tablet qd prn stop buspar 15 bid was taking prn but explained this is not a prn medication atarax qhs prn may also help make him sleepy  Migraine without status migrainosus, not intractable, unspecified migraine type - Plan: nortriptyline (PAMELOR) 25 MG capsule, prn maxalt and excedrin f/u Dr. Tomi Likens if needed in the future  Intractable migraine with aura with status migrainosus with dizziness ? Vertigo with migraines - Plan: meclizine (ANTIVERT) 25 MG tablet Migraine with vertigo - Plan: meclizine (ANTIVERT) 25 MG tablet  Abrasion dog scratches b/l arms- Plan: mupirocin ointment (BACTROBAN) 2 %  Hyperlipidemia, unspecified hyperlipidemia type - Plan: Lipid panel    Provider: Dr. Olivia Mackie McLean-Scocuzza-Internal Medicine

## 2021-07-05 ENCOUNTER — Other Ambulatory Visit: Payer: Self-pay | Admitting: Internal Medicine

## 2021-07-05 DIAGNOSIS — G43109 Migraine with aura, not intractable, without status migrainosus: Secondary | ICD-10-CM

## 2021-07-05 DIAGNOSIS — G43111 Migraine with aura, intractable, with status migrainosus: Secondary | ICD-10-CM

## 2021-12-06 ENCOUNTER — Encounter: Payer: Self-pay | Admitting: Internal Medicine

## 2022-05-12 ENCOUNTER — Other Ambulatory Visit: Payer: Self-pay | Admitting: Internal Medicine

## 2022-05-12 DIAGNOSIS — F419 Anxiety disorder, unspecified: Secondary | ICD-10-CM

## 2022-05-15 ENCOUNTER — Other Ambulatory Visit: Payer: Self-pay | Admitting: Internal Medicine

## 2022-05-15 DIAGNOSIS — G43909 Migraine, unspecified, not intractable, without status migrainosus: Secondary | ICD-10-CM

## 2022-06-07 ENCOUNTER — Encounter: Payer: No Typology Code available for payment source | Admitting: Internal Medicine

## 2022-07-03 ENCOUNTER — Encounter: Payer: Self-pay | Admitting: Internal Medicine

## 2022-07-03 ENCOUNTER — Ambulatory Visit (INDEPENDENT_AMBULATORY_CARE_PROVIDER_SITE_OTHER): Payer: No Typology Code available for payment source | Admitting: Internal Medicine

## 2022-07-03 VITALS — BP 116/70 | HR 96 | Temp 98.3°F | Ht 74.0 in | Wt 190.0 lb

## 2022-07-03 DIAGNOSIS — Z23 Encounter for immunization: Secondary | ICD-10-CM | POA: Diagnosis not present

## 2022-07-03 DIAGNOSIS — Z0000 Encounter for general adult medical examination without abnormal findings: Secondary | ICD-10-CM

## 2022-07-03 DIAGNOSIS — G43109 Migraine with aura, not intractable, without status migrainosus: Secondary | ICD-10-CM

## 2022-07-03 DIAGNOSIS — R000 Tachycardia, unspecified: Secondary | ICD-10-CM

## 2022-07-03 DIAGNOSIS — Z1329 Encounter for screening for other suspected endocrine disorder: Secondary | ICD-10-CM

## 2022-07-03 DIAGNOSIS — G43111 Migraine with aura, intractable, with status migrainosus: Secondary | ICD-10-CM

## 2022-07-03 DIAGNOSIS — G43909 Migraine, unspecified, not intractable, without status migrainosus: Secondary | ICD-10-CM

## 2022-07-03 DIAGNOSIS — Z1389 Encounter for screening for other disorder: Secondary | ICD-10-CM

## 2022-07-03 DIAGNOSIS — F419 Anxiety disorder, unspecified: Secondary | ICD-10-CM

## 2022-07-03 DIAGNOSIS — E559 Vitamin D deficiency, unspecified: Secondary | ICD-10-CM

## 2022-07-03 LAB — CBC WITH DIFFERENTIAL/PLATELET
Basophils Absolute: 0 10*3/uL (ref 0.0–0.1)
Basophils Relative: 0.6 % (ref 0.0–3.0)
Eosinophils Absolute: 0 10*3/uL (ref 0.0–0.7)
Eosinophils Relative: 0.6 % (ref 0.0–5.0)
HCT: 49.3 % (ref 39.0–52.0)
Hemoglobin: 17.2 g/dL — ABNORMAL HIGH (ref 13.0–17.0)
Lymphocytes Relative: 33.2 % (ref 12.0–46.0)
Lymphs Abs: 2.1 10*3/uL (ref 0.7–4.0)
MCHC: 34.9 g/dL (ref 30.0–36.0)
MCV: 88.4 fl (ref 78.0–100.0)
Monocytes Absolute: 0.8 10*3/uL (ref 0.1–1.0)
Monocytes Relative: 12.9 % — ABNORMAL HIGH (ref 3.0–12.0)
Neutro Abs: 3.3 10*3/uL (ref 1.4–7.7)
Neutrophils Relative %: 52.7 % (ref 43.0–77.0)
Platelets: 175 10*3/uL (ref 150.0–400.0)
RBC: 5.57 Mil/uL (ref 4.22–5.81)
RDW: 12 % (ref 11.5–15.5)
WBC: 6.3 10*3/uL (ref 4.0–10.5)

## 2022-07-03 LAB — COMPREHENSIVE METABOLIC PANEL
ALT: 28 U/L (ref 0–53)
AST: 21 U/L (ref 0–37)
Albumin: 4.8 g/dL (ref 3.5–5.2)
Alkaline Phosphatase: 83 U/L (ref 39–117)
BUN: 11 mg/dL (ref 6–23)
CO2: 23 mEq/L (ref 19–32)
Calcium: 10.1 mg/dL (ref 8.4–10.5)
Chloride: 104 mEq/L (ref 96–112)
Creatinine, Ser: 1 mg/dL (ref 0.40–1.50)
GFR: 105.8 mL/min (ref >60.00)
Glucose, Bld: 93 mg/dL (ref 70–99)
Potassium: 3.7 mEq/L (ref 3.5–5.1)
Sodium: 138 mEq/L (ref 135–145)
Total Bilirubin: 0.6 mg/dL (ref 0.2–1.2)
Total Protein: 7.9 g/dL (ref 6.0–8.3)

## 2022-07-03 LAB — LIPID PANEL
Cholesterol: 152 mg/dL (ref 0–200)
HDL: 36 mg/dL — ABNORMAL LOW (ref >39.00)
NonHDL: 115.77
Total CHOL/HDL Ratio: 4
Triglycerides: 331 mg/dL — ABNORMAL HIGH (ref 0.0–149.0)
VLDL: 66.2 mg/dL — ABNORMAL HIGH (ref 0.0–40.0)

## 2022-07-03 LAB — VITAMIN D 25 HYDROXY (VIT D DEFICIENCY, FRACTURES): VITD: 23.3 ng/mL — ABNORMAL LOW (ref 30.00–100.00)

## 2022-07-03 LAB — TSH: TSH: 1.82 u[IU]/mL (ref 0.35–5.50)

## 2022-07-03 LAB — LDL CHOLESTEROL, DIRECT: Direct LDL: 85 mg/dL

## 2022-07-03 MED ORDER — HYDROXYZINE HCL 25 MG PO TABS: ORAL_TABLET | ORAL | 3 refills | Status: DC

## 2022-07-03 MED ORDER — RIZATRIPTAN BENZOATE 10 MG PO TABS: ORAL_TABLET | ORAL | 11 refills | Status: DC

## 2022-07-03 NOTE — Patient Instructions (Addendum)
[address] in [employer]   Consider covid shot  [facility] primary care or in [place] okay   [facility] as given the info before for both  [facility] and [facility] [place]  [address]  [place] [address] [phone]    [facility] [place] * [address]  [place] [address]  [phone]  Sinus Tachycardia  Sinus tachycardia is a kind of fast heartbeat. In sinus tachycardia, the heart beats more than 100 times a minute. Sinus tachycardia starts in a part of the heart called the sinus node. Sinus tachycardia may be harmless, or it may be a sign of a serious condition. What are the causes? This condition may be caused by: Exercise or exertion. A fever. Pain. Loss of body fluids (dehydration). Severe bleeding (hemorrhage). Anxiety and stress. Certain substances, including: Alcohol. Caffeine. Tobacco and nicotine products. Cold medicines. Illegal drugs. Medical conditions including: Heart disease. An infection. An overactive thyroid (hyperthyroidism). A lack of red blood cells (anemia). What are the signs or symptoms? Symptoms of this condition include: A feeling that the heart is beating quickly (palpitations). Suddenly noticing your heartbeat (cardiac awareness). Dizziness. Tiredness (fatigue). Shortness of breath. Chest pain. Nausea. Fainting. How is this diagnosed? This condition is diagnosed with: A physical exam. Other tests, such as: Blood tests. An electrocardiogram (ECG). This test measures the electrical activity of the heart. Ambulatory cardiac monitor. This records your heartbeats for 24 hours or more. You may be referred to a heart specialist (cardiologist). How is this treated? Treatment for this condition depends on the cause or the underlying condition. Treatment may involve: Treating the underlying condition. Taking new medicines or changing your current medicines  as told by your health care provider. Making changes to your diet or lifestyle. Follow these instructions at home: Lifestyle  Do not use any products that contain nicotine or tobacco, such as cigarettes and e-cigarettes. If you need help quitting, ask your health care provider. Do not use illegal drugs, such as cocaine. Learn relaxation methods to help you when you get stressed or anxious. These include deep breathing. Avoid caffeine or other stimulants. Alcohol use  Do not drink alcohol if: Your health care provider tells you not to drink. You are pregnant, may be pregnant, or are planning to become pregnant. If you drink alcohol, limit how much you have: 0-1 drink a day for women. 0-2 drinks a day for men. Be aware of how much alcohol is in your drink. In the [place] one drink equals one typical bottle of beer (12 oz), one-half glass of wine (5 oz), or one shot of hard liquor (1 oz). General instructions Drink enough fluids to keep your urine pale yellow. Take over-the-counter and prescription medicines only as told by your health care provider. Keep all follow-up visits as told by your health care provider. This is important. Contact a health care provider if you have: A fever. Vomiting or diarrhea that does not go away. Get help right away if you: Have pain in your chest, upper arms, jaw, or neck. Become weak or dizzy. Feel faint. Have palpitations that do not go away. Summary In sinus tachycardia, the heart beats more than 100 times a minute. Sinus tachycardia may be harmless, or it may be a sign of a serious condition. Treatment for this condition depends on the cause or the underlying condition. Get help right away if you have pain in your chest, upper arms, jaw,  or neck. This information is not intended to replace advice given to you by your health care provider. Make sure you discuss any questions you have with your health care provider. Document Revised: 02/22/2021  Document Reviewed: 02/22/2021 Elsevier Patient Education  2023 [employer].

## 2022-07-03 NOTE — Progress Notes (Signed)
Chief Complaint  Patient presents with   Annual Exam   Annual doing well  1. Chronic anxiety insurance will change bcbs home [facility] and will find another pcp and therapy is covered will look into this HR 117 likely due to anxiety he stopped buspar but takes prn atarax 25 mg qd and needs refills  2. H/o migraines wants refill maxalt     Review of Systems  Constitutional:  Negative for weight loss.  HENT:  Negative for hearing loss.   Eyes:  Negative for blurred vision.  Respiratory:  Negative for shortness of breath.   Cardiovascular:  Negative for chest pain.  Gastrointestinal:  Negative for abdominal pain and blood in stool.  Genitourinary:  Negative for dysuria.  Musculoskeletal:  Negative for falls and joint pain.  Skin:  Negative for rash.  Neurological:  Negative for headaches.  Psychiatric/Behavioral:  Negative for depression.    Past Medical History:  Diagnosis Date   Anxiety    Frequent headaches    Polycythemia    Past Surgical History:  Procedure Laterality Date   TONSILLECTOMY AND ADENOIDECTOMY  2008   WISDOM TOOTH EXTRACTION     Family History  Problem Relation Age of Onset   Kidney Stones Mother    Diabetes Father        prediabetes resolved   Diabetes Maternal Grandmother    Migraines Maternal Grandfather    Stroke Maternal Grandfather    Other Maternal Grandfather        carotid artery stenosis    Diabetes Paternal Grandfather        amputation   Cancer Paternal Grandfather    Alcohol abuse Paternal Aunt    Social History   Socioeconomic History   Marital status: Married    Spouse name: Not on file   Number of children: Not on file   Years of education: Not on file   Highest education level: 12th grade  Occupational History   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 Has 1 dog   Has an occasional coffee or soda. Drinks approximately 12oz of hot tea a day.   Lives with GF now fiance as of 10/27/20    Only child    Getting married summer 09/2021    Social Determinants of Health   Financial Resource Strain: Not on file  Food Insecurity: Not on file  Transportation Needs: Not on file  Physical Activity: Not on file  Stress: Not on file  Social Connections: Not on file  Intimate Partner Violence: Not on file   Current Meds  Medication Sig   aspirin-acetaminophen-caffeine (EXCEDRIN MIGRAINE) 250-250-65 MG tablet Take by mouth every 6 (six) hours as needed for headache. Excedrin migraine   ibuprofen (ADVIL,MOTRIN) 200 MG tablet Take 200 mg by mouth every 6 (six) hours as needed.   meclizine (ANTIVERT) 25 MG tablet Take 1/2 to 1 tablet by mouth twice daily as needed for dizziness.   mupirocin ointment (BACTROBAN) 2 % Apply 1 application topically 2 (two) times daily. Prn arm abrasions   nortriptyline (PAMELOR) 25 MG capsule Take 1 capsule  by mouth at bedtime.   [DISCONTINUED] hydrOXYzine (ATARAX) 25 MG tablet Take 1 tablet by mouth once daily as needed.  Stop buspar   [DISCONTINUED] rizatriptan (MAXALT) 10 MG tablet Take 1 tablet (54m) by mouth as needed for migraine. May repeat 1 dose in 2 hours if needed. Max dose of 232mdaily.   No Known Allergies No results found for this or any previous visit (from the past 2160 hour(s)). Objective  Body mass index is 24.39 kg/m. Wt Readings from Last 3 Encounters:  07/03/22 190 lb (86.2 kg)  06/05/21 192 lb 6.4 oz (87.3 kg)  01/24/21 182 lb (82.6 kg)   Temp Readings from Last 3 Encounters:  07/03/22 98.3 F (36.8 C) (Oral)  06/05/21 (!) 97.1 F (36.2 C) (Temporal)  01/24/21 98.7 F (37.1 C) (Tympanic)   BP Readings from Last 3 Encounters:  07/03/22 116/70   06/05/21 126/84  11/28/20 130/88   Pulse Readings from Last 3 Encounters:  07/03/22 96  06/05/21 92  01/24/21 (!) 120    Physical Exam Vitals and nursing note reviewed.  Constitutional:      Appearance: Normal appearance. He is well-developed and well-groomed.  HENT:     Head: Normocephalic and atraumatic.  Eyes:     Conjunctiva/sclera: Conjunctivae normal.     Pupils: Pupils are equal, round, and reactive to light.  Cardiovascular:     Rate and Rhythm: Normal rate and regular rhythm.     Heart sounds: Normal heart sounds.  Pulmonary:     Effort: Pulmonary effort is normal. No respiratory distress.     Breath sounds: Normal breath sounds.  Abdominal:     Tenderness: There is no abdominal tenderness.  Skin:    General: Skin is warm and moist.  Neurological:     General: No focal deficit present.     Mental Status: He is alert and oriented to person, place, and time. Mental status is at baseline.     Sensory: Sensation is intact.     Motor: Motor function is intact.     Coordination: Coordination is intact.     Gait: Gait is intact. Gait normal.  Psychiatric:        Attention and Perception: Attention and perception normal.        Mood and Affect: Mood and affect normal.        Speech: Speech normal.        Behavior: Behavior normal. Behavior is cooperative.        Thought Content: Thought content normal.        Cognition and Memory: Cognition and memory normal.        Judgment: Judgment normal.     Assessment  Plan  Annual physical exam - Plan: Comprehensive metabolic panel, Lipid panel, CBC with Differential/Platelet See below   Need for immunization against influenza - Plan: Flu Vaccine QUAD 27m95mo (Fluarix, Fluzone & Alfiuria Quad PF)  Sinus tachycardia - Plan: TSH Recheck manual 96   Migraine without status migrainosus, not intractable, unspecified migraine type Intractable migraine with aura with status migrainosus - Plan: rizatriptan (MAXALT) 10 MG  tablet Migraine with vertigo - Plan: rizatriptan (MAXALT) 10 MG tablet  Anxiety - Plan: hydrOXYzine (ATARAX) 25 MG tablet   HM Flu shot given today Tdap utd 11/25/2018  3/3 moderna not had covid 06/05/21 covid 19 pt will get covid shot at pharmacy  Hep B immune  MMR immune    HPV vaccines (had) STD neg Dermatology [place] derm referred in the  past [facility]  rec healthy diet and exercise rec mvt with vitamin D3 for total in both equal to 5000 IU daily D3    Provider: Dr. Olivia Mackie McLean-Scocuzza-Internal Medicine

## 2022-07-04 LAB — URINALYSIS, ROUTINE W REFLEX MICROSCOPIC
Bacteria, UA: NONE SEEN /HPF
Bilirubin Urine: NEGATIVE
Glucose, UA: NEGATIVE
Hgb urine dipstick: NEGATIVE
Ketones, ur: NEGATIVE
Leukocytes,Ua: NEGATIVE
Nitrite: NEGATIVE
RBC / HPF: NONE SEEN /HPF (ref 0–2)
Specific Gravity, Urine: 1.027 (ref 1.001–1.035)
Squamous Epithelial / HPF: NONE SEEN /HPF (ref <=5)
pH: 5.5 (ref 5.0–8.0)

## 2022-07-08 ENCOUNTER — Telehealth: Payer: Self-pay

## 2022-07-08 NOTE — Telephone Encounter (Signed)
LMOM for pt to CB in regards to labs 

## 2023-06-07 ENCOUNTER — Telehealth: Payer: Self-pay | Admitting: Family

## 2023-06-07 DIAGNOSIS — G43909 Migraine, unspecified, not intractable, without status migrainosus: Secondary | ICD-10-CM

## 2023-06-16 ENCOUNTER — Other Ambulatory Visit: Payer: Self-pay

## 2023-06-16 DIAGNOSIS — G43909 Migraine, unspecified, not intractable, without status migrainosus: Secondary | ICD-10-CM

## 2023-06-16 NOTE — Telephone Encounter (Signed)
Prescription Request  06/16/2023  LOV: Visit date not found  What is the name of the medication or equipment? nortriptyline (PAMELOR) 25 MG capsule  Have you contacted your pharmacy to request a refill? Yes   Which pharmacy would you like this sent to?   [id number] - [place], [place] - [address] AT [facility] [address] [facility] [address] [place] [address] Phone: [phone] Fax: [phone]   Patient notified that their request is being sent to the clinical staff for review and that they should receive a response within 2 business days.   Please advise at Mobile [phone] (mobile)   Pt stated if provider can refills this med for him, pt has a TOC on 06/24/23.

## 2023-06-16 NOTE — Telephone Encounter (Signed)
Sent rx to pcp

## 2023-06-16 NOTE — Telephone Encounter (Signed)
Sent to neurology

## 2023-06-18 NOTE — Telephone Encounter (Signed)
Left message to return call to our office.  

## 2023-06-24 ENCOUNTER — Encounter: Payer: Self-pay | Admitting: Family Medicine

## 2023-06-24 ENCOUNTER — Ambulatory Visit: Payer: BC Managed Care – PPO | Admitting: Family Medicine

## 2023-06-24 VITALS — BP 124/82 | HR 80 | Temp 98.0°F | Resp 16 | Ht 74.0 in | Wt 196.1 lb

## 2023-06-24 DIAGNOSIS — E538 Deficiency of other specified B group vitamins: Secondary | ICD-10-CM | POA: Diagnosis not present

## 2023-06-24 DIAGNOSIS — F419 Anxiety disorder, unspecified: Secondary | ICD-10-CM

## 2023-06-24 DIAGNOSIS — Z13220 Encounter for screening for lipoid disorders: Secondary | ICD-10-CM | POA: Diagnosis not present

## 2023-06-24 DIAGNOSIS — G43009 Migraine without aura, not intractable, without status migrainosus: Secondary | ICD-10-CM

## 2023-06-24 DIAGNOSIS — E781 Pure hyperglyceridemia: Secondary | ICD-10-CM | POA: Diagnosis not present

## 2023-06-24 DIAGNOSIS — D751 Secondary polycythemia: Secondary | ICD-10-CM | POA: Diagnosis not present

## 2023-06-24 DIAGNOSIS — F39 Unspecified mood [affective] disorder: Secondary | ICD-10-CM

## 2023-06-24 DIAGNOSIS — Z3189 Encounter for other procreative management: Secondary | ICD-10-CM

## 2023-06-24 DIAGNOSIS — R000 Tachycardia, unspecified: Secondary | ICD-10-CM

## 2023-06-24 DIAGNOSIS — E559 Vitamin D deficiency, unspecified: Secondary | ICD-10-CM

## 2023-06-24 MED ORDER — HYDROXYZINE HCL 25 MG PO TABS: ORAL_TABLET | ORAL | 3 refills | Status: DC

## 2023-06-24 MED ORDER — NURTEC 75 MG PO TBDP: 75.0000 mg | ORAL_TABLET | Freq: Every day | ORAL | 2 refills | Status: AC | PRN

## 2023-06-24 NOTE — Patient Instructions (Addendum)
It was a pleasure meeting you today. Thank you for allowing me to take part in your health care.  Our goals for today as we discussed include:  We will get some labs today.  If they are abnormal or we need to do something about them, I will call you.  If they are normal, I will send you a message on MyChart (if it is active) or a letter in the mail.  If you don't hear from [place] in 2 weeks, please call the office at the number below.   [facility]  [address] [place] [phone]  Recommend trying to conceive for 12 months Avoid tobacco use, alcohol. Important to have proper nutrition and regular exercise.   Start Nurtec at onset of Migraine  [facility] [place]  [address] [place] [address] [phone]    For Mental Health Concerns  [facility] [phone] Address: [address] [place], [place] [address] Hours: Open 24/7, No appointment required.    [email or web address]  [email or web address]  Recommend scheduling appointment with Dr Caryn Section [address], [address], [place], [place] [address] [phone]   Follow up as needed  If you have any questions or concerns, please do not hesitate to call the office at [phone].  I look forward to our next visit and until then take care and stay safe.  Regards,   Dana Allan, MD   [facility]

## 2023-06-24 NOTE — Progress Notes (Signed)
SUBJECTIVE:   Chief Complaint  Patient presents with   Establish Care   HPI Presents to clinic to transfer care  No acute concerns today  History of Migraines Previously seen Neurology.  Was started on Nortriptyline but discontinued as started Atarax for anxiety and felt to be helpful.  Reports did not like side effects of TCA and prefers now feeling of more clarity.  Continues to have migraines every 1-2 months. Has not followed up with Neurology in few years.    Requesting fertility testing Has been trying to conceive for 6 months with partner.  Sexual activity fairly frequent and still unable to conceive.  Reports partner planning to discuss fertility with her provider as well.  Denies any difficulty with sexual desire or performance, ejaculation, or obtaining erections. Discussed fertility testing usually takes place after 1 year of unsuccessful attempts but would be happy to refer to fertility clinic or Urology if interested to start now.  Patient opted to wait for now and discuss with significant other.  Mood disorder Endorses depressed mood. Has no interest in doing partaking in social events.  Would prefer to stay at home. 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  Wife sometimes joins.  Does not sleep well.  Appetite has not changed but endorses poor nutrition. No unintentional weight loss.  Weight remains stable.     PERTINENT PMH / PSH: Migraine Mood disorder  OBJECTIVE:  BP 124/82   Pulse 80   Temp 98 F (36.7 C)   Resp 16   Ht 6\' 2"  (1.88 m)   Wt 196 lb 2 oz (89 kg)   SpO2 98%   BMI 25.18 kg/m    Physical Exam Vitals reviewed.  HENT:     Head: Normocephalic.     Right Ear: Tympanic membrane, ear canal and external ear normal.     Left Ear: Tympanic membrane, ear canal and external ear normal.     Nose: Nose normal.     Mouth/Throat:     Mouth: Mucous membranes are moist.  Eyes:     Conjunctiva/sclera: Conjunctivae normal.     Pupils: Pupils are equal, round, and  reactive to light.  Neck:     Thyroid: No thyromegaly or thyroid tenderness.     Vascular: No carotid bruit.  Cardiovascular:     Rate and Rhythm: Normal rate and regular rhythm.     Pulses: Normal pulses.     Heart sounds: Normal heart sounds.  Pulmonary:     Effort: Pulmonary effort is normal.     Breath sounds: Normal breath sounds.  Abdominal:     General: Abdomen is flat. Bowel sounds are normal.     Palpations: Abdomen is soft.  Musculoskeletal:        General: Normal range of motion.     Cervical back: Normal range of motion and neck supple.     Right lower leg: No edema.     Left lower leg: No edema.  Lymphadenopathy:     Cervical: No cervical adenopathy.  Neurological:     Mental Status: He is alert.  Psychiatric:        Mood and Affect: Mood normal.        Behavior: Behavior normal.        Thought Content: Thought content normal.        Judgment: Judgment normal.        06/24/2023    2:10 PM 07/03/2022    1:02 PM 06/05/2021    2:32  PM 05/26/2020   11:26 AM 12/24/2019    2:32 PM  Depression screen PHQ 2/9  Decreased Interest 0 1 1 1 1   Down, Depressed, Hopeless 1 0 1 1 0  PHQ - 2 Score 1 1 2 2 1   Altered sleeping 3  3 3 3   Tired, decreased energy 2  1 2 1   Change in appetite 1  0 1 1  Feeling bad or failure about yourself  0  0 1 1  Trouble concentrating 0  0 1 0  Moving slowly or fidgety/restless 0  0 1 0  Suicidal thoughts 0  0 0 0  PHQ-9 Score 7  6 11 7   Difficult doing work/chores Somewhat difficult  Somewhat difficult Somewhat difficult Somewhat difficult      06/24/2023    2:11 PM 06/05/2021    2:32 PM 05/26/2020   11:26 AM 12/24/2019    2:32 PM  GAD 7 : Generalized Anxiety Score  Nervous, Anxious, on Edge 2 1 3 2   Control/stop worrying 1 0 2 1  Worry too much - different things 1 0 2 1  Trouble relaxing 1 0 2 1  Restless 0 0 1 0  Easily annoyed or irritable 2 1 3 2   Afraid - awful might happen 1 0 2 0  Total GAD 7 Score 8 2 15 7   Anxiety  Difficulty Somewhat difficult Not difficult at all Somewhat difficult Somewhat difficult    ASSESSMENT/PLAN:  Mood disorder (HCC) Assessment & Plan: Chronic.  Denies SI/HI PHQ9/GAD borderline MDQ screening positive Encourage CBT Refill Atarax 25 mg daily prn Refer to psychiatry for evaluation  Orders: -     hydrOXYzine HCl; Take 1 tablet by mouth once daily as needed.  Dispense: 90 tablet; Refill: 3 -     TSH -     Comprehensive metabolic panel -     Ambulatory referral to Psychiatry  Polycythemia Assessment & Plan: History of elevated Hbg Will repeat CBC today  Orders: -     CBC  Vitamin D deficiency Assessment & Plan: Low vitamin D level Start vitamin D 1000IU daily  Orders: -     VITAMIN D 25 Hydroxy (Vit-D Deficiency, Fractures) -     Vitamin D3; Take 1 capsule (1,000 Units total) by mouth daily.  Vitamin B 12 deficiency -     Vitamin B12  Lipid screening -     Lipid panel  Migraine without aura and without status migrainosus, not intractable Assessment & Plan: Continues to have migraines without aura 1-2 times monthly. Associated with photophobia and nausea.   Did not like side effects of Nortriptyline.  D/C TCA Trial Nurtec 75 mg as needed Failed Imitrex therapy previously Encourage better nutrition, sleep, activity and decrease stress.  Recommend decreased processed foods, caffeine intake. Encouraged increase water intake. Follow up as needed or if no improvement in symptoms   Orders: -     Nurtec; Take 1 tablet (75 mg total) by mouth daily as needed.  Dispense: 8 tablet; Refill: 2  Hypertriglyceridemia Assessment & Plan: Chronic Trigs >300 Recommend modification of diet and increase activity No history of familial disease  Orders: -     LDL cholesterol, direct  Encounter for fertility planning Assessment & Plan: Information provided for [facility]  Recommend trying to conceive for 12 months Avoid tobacco use, alcohol.  Important to have proper nutrition and regular exercise. Will check labs today.  Could also consider urology referral in future.  Patient will  discuss with partner and notify of decision    PDMP reviewed  Return if symptoms worsen or fail to improve, for PCP.  Dana Allan, MD

## 2023-06-25 LAB — CBC
HCT: 48.8 % (ref 39.0–52.0)
Hemoglobin: 16.7 g/dL (ref 13.0–17.0)
MCHC: 34.3 g/dL (ref 30.0–36.0)
MCV: 89.5 fl (ref 78.0–100.0)
Platelets: 213 10*3/uL (ref 150.0–400.0)
RBC: 5.45 Mil/uL (ref 4.22–5.81)
RDW: 12.7 % (ref 11.5–15.5)
WBC: 6.5 10*3/uL (ref 4.0–10.5)

## 2023-06-25 LAB — COMPREHENSIVE METABOLIC PANEL
ALT: 33 U/L (ref 0–53)
AST: 23 U/L (ref 0–37)
Albumin: 4.7 g/dL (ref 3.5–5.2)
Alkaline Phosphatase: 69 U/L (ref 39–117)
BUN: 9 mg/dL (ref 6–23)
CO2: 26 meq/L (ref 19–32)
Calcium: 10 mg/dL (ref 8.4–10.5)
Chloride: 102 meq/L (ref 96–112)
Creatinine, Ser: 0.92 mg/dL (ref 0.40–1.50)
GFR: 116.14 mL/min (ref >60.00)
Glucose, Bld: 90 mg/dL (ref 70–99)
Potassium: 4.2 meq/L (ref 3.5–5.1)
Sodium: 137 meq/L (ref 135–145)
Total Bilirubin: 0.7 mg/dL (ref 0.2–1.2)
Total Protein: 7.3 g/dL (ref 6.0–8.3)

## 2023-06-25 LAB — LIPID PANEL
Cholesterol: 163 mg/dL (ref 0–200)
HDL: 33.3 mg/dL — ABNORMAL LOW (ref >39.00)
NonHDL: 129.83
Total CHOL/HDL Ratio: 5
Triglycerides: 329 mg/dL — ABNORMAL HIGH (ref 0.0–149.0)
VLDL: 65.8 mg/dL — ABNORMAL HIGH (ref 0.0–40.0)

## 2023-06-25 LAB — VITAMIN D 25 HYDROXY (VIT D DEFICIENCY, FRACTURES): VITD: 20.95 ng/mL — ABNORMAL LOW (ref 30.00–100.00)

## 2023-06-25 LAB — TSH: TSH: 1.11 u[IU]/mL (ref 0.35–5.50)

## 2023-06-25 LAB — LDL CHOLESTEROL, DIRECT: Direct LDL: 115 mg/dL

## 2023-06-25 LAB — VITAMIN B12: Vitamin B-12: 244 pg/mL (ref 211–911)

## 2023-06-27 ENCOUNTER — Telehealth: Payer: Self-pay | Admitting: Pharmacy Technician

## 2023-06-27 NOTE — Telephone Encounter (Signed)
Pharmacy Patient Advocate Encounter   Received notification from CoverMyMeds that prior authorization for Nurtec 75MG  dispersible tablets is required/requested.   Insurance verification completed.   The patient is insured through [facility] .   Per test claim: PA required; PA submitted to [facility] via CoverMyMeds Key/confirmation #/EOC [id number] Status is pending

## 2023-07-01 ENCOUNTER — Encounter: Payer: Self-pay | Admitting: Family Medicine

## 2023-07-02 ENCOUNTER — Other Ambulatory Visit (HOSPITAL_COMMUNITY): Payer: Self-pay

## 2023-07-04 NOTE — Telephone Encounter (Signed)
Pharmacy Patient Advocate Encounter  Received notification from [facility] that Prior Authorization for  Nurtec 75MG  dispersible tablets  has been  PARTIALLY APPROVED- plan covers 16 tablets per month.    PA #/Case ID/Reference #:  [id number]

## 2023-07-12 ENCOUNTER — Encounter: Payer: Self-pay | Admitting: Family Medicine

## 2023-07-12 DIAGNOSIS — Z13220 Encounter for screening for lipoid disorders: Secondary | ICD-10-CM | POA: Insufficient documentation

## 2023-07-12 DIAGNOSIS — R000 Tachycardia, unspecified: Secondary | ICD-10-CM | POA: Insufficient documentation

## 2023-07-12 DIAGNOSIS — E538 Deficiency of other specified B group vitamins: Secondary | ICD-10-CM | POA: Insufficient documentation

## 2023-07-12 DIAGNOSIS — E781 Pure hyperglyceridemia: Secondary | ICD-10-CM | POA: Insufficient documentation

## 2023-07-12 DIAGNOSIS — Z3189 Encounter for other procreative management: Secondary | ICD-10-CM | POA: Insufficient documentation

## 2023-07-12 MED ORDER — VITAMIN D3 25 MCG (1000 UT) PO CAPS: 1000.0000 [IU] | ORAL_CAPSULE | Freq: Every day | ORAL | Status: DC

## 2023-07-12 NOTE — Assessment & Plan Note (Signed)
History of elevated Hbg Will repeat CBC today

## 2023-07-12 NOTE — Assessment & Plan Note (Signed)
Chronic Trigs >300 Recommend modification of diet and increase activity No history of familial disease

## 2023-07-12 NOTE — Assessment & Plan Note (Signed)
Low vitamin D level Start vitamin D 1000IU daily

## 2023-07-12 NOTE — Assessment & Plan Note (Addendum)
Chronic.  Denies SI/HI PHQ9/GAD borderline MDQ screening positive Encourage CBT Refill Atarax 25 mg daily prn Refer to psychiatry for evaluation

## 2023-07-12 NOTE — Assessment & Plan Note (Signed)
Information provided for [facility]  Recommend trying to conceive for 12 months Avoid tobacco use, alcohol. Important to have proper nutrition and regular exercise. Will check labs today.  Could also consider urology referral in future.  Patient will discuss with partner and notify of decision

## 2023-07-12 NOTE — Assessment & Plan Note (Signed)
Continues to have migraines without aura 1-2 times monthly. Associated with photophobia and nausea.   Did not like side effects of Nortriptyline.  D/C TCA Trial Nurtec 75 mg as needed Failed Imitrex therapy previously Encourage better nutrition, sleep, activity and decrease stress.  Recommend decreased processed foods, caffeine intake. Encouraged increase water intake. Follow up as needed or if no improvement in symptoms

## 2023-07-14 ENCOUNTER — Telehealth: Payer: Self-pay | Admitting: Family Medicine

## 2023-07-14 NOTE — Telephone Encounter (Signed)
Donald Lane from [place] office called stating the pt is scheduled for 09/09/23

## 2023-12-25 ENCOUNTER — Ambulatory Visit: Payer: BC Managed Care – PPO | Admitting: Family Medicine

## 2024-01-16 ENCOUNTER — Ambulatory Visit (INDEPENDENT_AMBULATORY_CARE_PROVIDER_SITE_OTHER): Payer: BC Managed Care – PPO | Admitting: Family Medicine

## 2024-01-16 ENCOUNTER — Encounter: Payer: Self-pay | Admitting: Family Medicine

## 2024-01-16 VITALS — BP 124/88 | HR 89 | Temp 98.3°F | Resp 20 | Ht 74.0 in | Wt 194.5 lb

## 2024-01-16 DIAGNOSIS — G43009 Migraine without aura, not intractable, without status migrainosus: Secondary | ICD-10-CM

## 2024-01-16 DIAGNOSIS — F39 Unspecified mood [affective] disorder: Secondary | ICD-10-CM

## 2024-01-16 MED ORDER — QULIPTA 60 MG PO TABS: 60.0000 mg | ORAL_TABLET | Freq: Every day | ORAL | Status: DC

## 2024-01-16 MED ORDER — QULIPTA 60 MG PO TABS: 60.0000 mg | ORAL_TABLET | Freq: Every day | ORAL | 11 refills | Status: AC

## 2024-01-16 NOTE — Patient Instructions (Addendum)
 It was a pleasure meeting you today. Thank you for allowing me to take part in your health care.  Our goals for today as we discussed include:  Start Qulipta 60 mg daily.  Sample provided   Follow up with Dr Maryruth Bun and therapist as scheduled    This is a list of the screening recommended for you and due dates:  Health Maintenance  Topic Date Due   COVID-19 Vaccine (4 - 2024-25 season) 06/29/2023   Flu Shot  01/26/2024*   DTaP/Tdap/Td vaccine (10 - Td or Tdap) 12/29/2033   HPV Vaccine  Completed   Hepatitis C Screening  Completed   HIV Screening  Completed  *Topic was postponed. The date shown is not the original due date.     If you have any questions or concerns, please do not hesitate to call the office at [phone].  I look forward to our next visit and until then take care and stay safe.  Regards,   Dana Allan, MD   [facility]

## 2024-01-16 NOTE — Progress Notes (Signed)
 SUBJECTIVE:   Chief Complaint  Patient presents with   mood disorder    6 month follow up   HPI Presents for follow up chronic disease management  Discussed the use of AI scribe software for clinical note transcription with the patient, who gave verbal consent to proceed.  History of Present Illness Donald Lane "Thayer Ohm" is a 28 year old male with generalized anxiety disorder, depression, and obsessive-compulsive disorder who presents for follow-up of his mental health conditions.  He has been attending therapy and seeing a psychiatrist, which has significantly improved his anxiety and depression scores. He is currently taking sertraline 100 mg daily but has not been using hydroxyzine despite having a prescription for it.  He experiences frequent migraines, using Nurtec as an abortive treatment. Headaches occur approximately three times a week, but he only takes Nurtec once every other week due to insurance limitations. He has tried various abortive medications, including rizatriptan, which he did not tolerate well due to side effects like nausea and dizziness. Nortriptyline was ineffective as a prophylactic treatment, and he is currently exploring other prophylactic options.  His wife is pregnant, which has been a source of anxiety. She is [redacted] weeks pregnant and has gestational diabetes and high blood pressure, which may necessitate an early induction. The pregnancy was unexpected, as he was planning to undergo fertility testing when he discovered the pregnancy.    PERTINENT PMH / PSH: As above  OBJECTIVE:  BP 124/88   Pulse 89   Temp 98.3 F (36.8 C)   Resp 20   Ht 6\' 2"  (1.88 m)   Wt 194 lb 8 oz (88.2 kg)   SpO2 97%   BMI 24.97 kg/m    Physical Exam Vitals reviewed.  Constitutional:      General: He is not in acute distress.    Appearance: Normal appearance. He is normal weight. He is not ill-appearing, toxic-appearing or diaphoretic.  Eyes:     General:         Right eye: No discharge.        Left eye: No discharge.  Cardiovascular:     Rate and Rhythm: Normal rate and regular rhythm.     Heart sounds: Normal heart sounds.  Pulmonary:     Effort: Pulmonary effort is normal.     Breath sounds: Normal breath sounds.  Abdominal:     General: Bowel sounds are normal.  Musculoskeletal:        General: Normal range of motion.     Cervical back: Normal range of motion.  Skin:    General: Skin is warm and dry.  Neurological:     Mental Status: He is alert and oriented to person, place, and time. Mental status is at baseline.  Psychiatric:        Mood and Affect: Mood normal.        Behavior: Behavior normal.        Thought Content: Thought content normal.        Judgment: Judgment normal.           06/24/2023    2:10 PM 07/03/2022    1:02 PM 06/05/2021    2:32 PM 05/26/2020   11:26 AM 12/24/2019    2:32 PM  Depression screen PHQ 2/9  Decreased Interest 0 1 1 1 1   Down, Depressed, Hopeless 1 0 1 1 0  PHQ - 2 Score 1 1 2 2 1   Altered sleeping 3  3 3 3   Tired,  decreased energy 2  1 2 1   Change in appetite 1  0 1 1  Feeling bad or failure about yourself  0  0 1 1  Trouble concentrating 0  0 1 0  Moving slowly or fidgety/restless 0  0 1 0  Suicidal thoughts 0  0 0 0  PHQ-9 Score 7  6 11 7   Difficult doing work/chores Somewhat difficult  Somewhat difficult Somewhat difficult Somewhat difficult      06/24/2023    2:11 PM 06/05/2021    2:32 PM 05/26/2020   11:26 AM 12/24/2019    2:32 PM  GAD 7 : Generalized Anxiety Score  Nervous, Anxious, on Edge 2 1 3 2   Control/stop worrying 1 0 2 1  Worry too much - different things 1 0 2 1  Trouble relaxing 1 0 2 1  Restless 0 0 1 0  Easily annoyed or irritable 2 1 3 2   Afraid - awful might happen 1 0 2 0  Total GAD 7 Score 8 2 15 7   Anxiety Difficulty Somewhat difficult Not difficult at all Somewhat difficult Somewhat difficult    ASSESSMENT/PLAN:  Migraine without aura and without status  migrainosus, not intractable Assessment & Plan: Frequent migraines with severe episodes biweekly. Nurtec provides 75% relief but limited by insurance. Previous treatments caused side effects. Considering Qulipta for prophylaxis pending insurance approval. - Start Qulipta daily for migraine prophylaxis, samples provided - Continue Nurtec as needed for acute migraine relief. - Explore copay card options for medication affordability.  Orders: Bennie Pierini; Take 1 tablet (60 mg total) by mouth daily.  Dispense: 30 tablet; Refill: 11 -     Qulipta; Take 1 tablet (60 mg total) by mouth daily.  Dispense: 8 tablet  Mood disorder (HCC) Assessment & Plan: OCD managed with sertraline and therapy. Significant improvement in anxiety and depression. Therapy beneficial, especially for anxiety related to wife's pregnancy. - Continue sertraline 100 mg daily. - Continue weekly therapy sessions with Edmonia Lynch. - Follow up with psychiatrist Dr. Edwin Dada as scheduled.     PDMP reviewed  Return if symptoms worsen or fail to improve, for PCP.  Dana Allan, MD

## 2024-01-25 ENCOUNTER — Encounter: Payer: Self-pay | Admitting: Family Medicine

## 2024-01-25 NOTE — Assessment & Plan Note (Signed)
 OCD managed with sertraline and therapy. Significant improvement in anxiety and depression. Therapy beneficial, especially for anxiety related to wife's pregnancy. - Continue sertraline 100 mg daily. - Continue weekly therapy sessions with Edmonia Lynch. - Follow up with psychiatrist Dr. Edwin Dada as scheduled.

## 2024-01-25 NOTE — Assessment & Plan Note (Signed)
 Frequent migraines with severe episodes biweekly. Nurtec provides 75% relief but limited by insurance. Previous treatments caused side effects. Considering Qulipta for prophylaxis pending insurance approval. - Start Qulipta daily for migraine prophylaxis, samples provided - Continue Nurtec as needed for acute migraine relief. - Explore copay card options for medication affordability.

## 2024-02-02 ENCOUNTER — Other Ambulatory Visit (HOSPITAL_COMMUNITY): Payer: Self-pay

## 2024-02-02 ENCOUNTER — Telehealth: Payer: Self-pay

## 2024-02-02 NOTE — Telephone Encounter (Signed)
 Pharmacy Patient Advocate Encounter  Received notification from [facility] that Prior Authorization for Qulipta 60MG  tablets  has been APPROVED from 02/02/24 to 05/03/24. Ran test claim, Copay is $0. This test claim was processed through [facility]- copay amounts may vary at other pharmacies due to pharmacy/plan contracts, or as the patient moves through the different stages of their insurance plan.   PA #/Case ID/Reference #: [id number]

## 2024-02-02 NOTE — Telephone Encounter (Signed)
 Pharmacy Patient Advocate Encounter   Received notification from CoverMyMeds that prior authorization for Qulipta 60MG  tablets is required/requested.   Insurance verification completed.   The patient is insured through [facility] .   Per test claim: PA required; PA submitted to above mentioned insurance via CoverMyMeds Key/confirmation #/EOC [id number] Status is pending

## 2024-02-03 NOTE — Telephone Encounter (Signed)
 Called patient to make him of aware of medication approval. Attempted to leave a message and call was disconnected during recording. Attempt again later.   OK to relay message if patient calls back. If relayed, please notify the office.

## 2024-02-03 NOTE — Telephone Encounter (Signed)
 Called pt and vm picked up then it was like someone hung up the phone. Did not give an option to leave a vm telling pt to call the office back

## 2024-02-04 ENCOUNTER — Telehealth: Payer: Self-pay | Admitting: Family Medicine

## 2024-02-04 NOTE — Telephone Encounter (Signed)
 See telephone encounter with crm

## 2024-02-04 NOTE — Telephone Encounter (Signed)
 Left message to return call to our office.

## 2024-02-04 NOTE — Telephone Encounter (Signed)
 Noted.

## 2024-02-04 NOTE — Telephone Encounter (Signed)
 Copied from CRM [id number]. Topic: General - Other >> Feb 04, 2024 10:50 AM Deaijah H wrote: Reason for CRM: Relayed message to patient "Called patient to make him of aware of medication approval"

## 2024-02-12 ENCOUNTER — Telehealth: Admitting: Physician Assistant

## 2024-02-12 DIAGNOSIS — H6993 Unspecified Eustachian tube disorder, bilateral: Secondary | ICD-10-CM

## 2024-02-12 MED ORDER — IPRATROPIUM BROMIDE 0.03 % NA SOLN
2.0000 | Freq: Two times a day (BID) | NASAL | 0 refills | Status: AC
Start: 2024-02-12 — End: ?

## 2024-02-12 NOTE — Progress Notes (Signed)

## 2024-02-12 NOTE — Progress Notes (Signed)
 I have spent 5 minutes in review of e-visit questionnaire, review and updating patient chart, medical decision making and response to patient.   Piedad Climes, PA-C

## 2024-04-05 ENCOUNTER — Other Ambulatory Visit (HOSPITAL_COMMUNITY): Payer: Self-pay

## 2024-04-05 ENCOUNTER — Telehealth: Payer: Self-pay

## 2024-04-05 NOTE — Telephone Encounter (Signed)
 Pharmacy Patient Advocate Encounter   Received notification from Onbase that prior authorization for Qulipta  60 is required/requested.   Insurance verification completed.   The patient is insured through [facility] .   Per test claim: The current 30 day co-pay is, $0.00.  No PA needed at this time. This test claim was processed through [facility]- copay amounts may vary at other pharmacies due to pharmacy/plan contracts, or as the patient moves through the different stages of their insurance plan.    Patient already has active prior authorization for this medication. See encounter 02/02/24.

## 2024-05-05 ENCOUNTER — Telehealth: Payer: Self-pay

## 2024-05-05 ENCOUNTER — Other Ambulatory Visit (HOSPITAL_COMMUNITY): Payer: Self-pay

## 2024-05-05 NOTE — Telephone Encounter (Signed)
 Pharmacy Patient Advocate Encounter   Received notification from CoverMyMeds that prior authorization for Qulipta  60MG  tablets is required/requested.   Insurance verification completed.   The patient is insured through [facility] .   Per test claim: PA required; PA started via CoverMyMeds. KEY [id number] . Waiting for clinical questions to populate.

## 2024-05-05 NOTE — Telephone Encounter (Signed)
 PLEASE BE ADVISED Clinical questions have been answered and PA submitted.TO PLAN. PA currently Pending.

## 2024-05-06 NOTE — Telephone Encounter (Signed)
 Pharmacy Patient Advocate Encounter  Received notification from [facility] that Prior Authorization for Qulipta  60MG  tablets has been APPROVED from 05/05/2024 to 05/05/2025   PA #/Case ID/Reference #: [id number]

## 2024-05-06 NOTE — Telephone Encounter (Signed)
 Left message to return call to our office.  Okay to relay message to pt that his medication has been approved. Please document when spoke to.

## 2024-07-26 ENCOUNTER — Encounter

## 2024-07-26 DIAGNOSIS — G43009 Migraine without aura, not intractable, without status migrainosus: Secondary | ICD-10-CM

## 2024-07-26 DIAGNOSIS — Z13220 Encounter for screening for lipoid disorders: Secondary | ICD-10-CM

## 2024-07-26 DIAGNOSIS — F39 Unspecified mood [affective] disorder: Secondary | ICD-10-CM

## 2024-07-26 DIAGNOSIS — D751 Secondary polycythemia: Secondary | ICD-10-CM

## 2024-11-08 ENCOUNTER — Encounter

## 2025-02-15 ENCOUNTER — Encounter
